# Patient Record
Sex: Female | Born: 1980
Health system: Southern US, Community
[De-identification: ages and names within clinical notes are randomized; demographics above are authoritative.]

## PROBLEM LIST (undated history)

## (undated) ENCOUNTER — Inpatient Hospital Stay (HOSPITAL_COMMUNITY): Payer: Self-pay

## (undated) DIAGNOSIS — G5602 Carpal tunnel syndrome, left upper limb: Secondary | ICD-10-CM

## (undated) HISTORY — PX: WISDOM TOOTH EXTRACTION: SHX21

---

## 1999-12-09 ENCOUNTER — Other Ambulatory Visit
Admission: RE | Admit: 1999-12-09 | Discharge: 1999-12-09 | Disposition: A | Discharge status: Home / Self Care | Payer: 59 | Source: Ambulatory Visit | Attending: Plastic Surgery | Admitting: Plastic Surgery

## 2001-10-09 ENCOUNTER — Other Ambulatory Visit: Admission: RE | Admit: 2001-10-09 | Discharge: 2001-10-09 | Payer: Self-pay | Admitting: Obstetrics and Gynecology

## 2003-01-29 ENCOUNTER — Other Ambulatory Visit: Admission: RE | Admit: 2003-01-29 | Discharge: 2003-01-29 | Payer: Self-pay | Admitting: Obstetrics and Gynecology

## 2004-01-15 ENCOUNTER — Other Ambulatory Visit: Admission: RE | Admit: 2004-01-15 | Discharge: 2004-01-15 | Payer: Self-pay | Admitting: Obstetrics & Gynecology

## 2007-03-02 ENCOUNTER — Emergency Department (HOSPITAL_COMMUNITY): Admission: EM | Admit: 2007-03-02 | Discharge: 2007-03-02 | Payer: Self-pay | Admitting: Family Medicine

## 2007-06-03 ENCOUNTER — Inpatient Hospital Stay: Admission: AD | Admit: 2007-06-03 | Discharge: 2007-06-03 | Payer: Self-pay | Admitting: Obstetrics & Gynecology

## 2007-06-20 ENCOUNTER — Inpatient Hospital Stay (HOSPITAL_COMMUNITY): Admission: AD | Admit: 2007-06-20 | Discharge: 2007-06-20 | Payer: Self-pay | Admitting: *Deleted

## 2007-07-08 ENCOUNTER — Inpatient Hospital Stay (HOSPITAL_COMMUNITY): Admission: AD | Admit: 2007-07-08 | Discharge: 2007-07-10 | Payer: Self-pay | Admitting: Obstetrics

## 2007-07-15 ENCOUNTER — Inpatient Hospital Stay (HOSPITAL_COMMUNITY): Admission: AD | Admit: 2007-07-15 | Discharge: 2007-07-15 | Payer: Self-pay | Admitting: Obstetrics & Gynecology

## 2007-08-09 LAB — CONVERTED CEMR LAB: Pap Smear: NORMAL

## 2007-08-14 ENCOUNTER — Ambulatory Visit (HOSPITAL_COMMUNITY): Admission: RE | Admit: 2007-08-14 | Discharge: 2007-08-14 | Payer: Self-pay | Admitting: Obstetrics & Gynecology

## 2007-09-27 ENCOUNTER — Ambulatory Visit (HOSPITAL_COMMUNITY): Admission: RE | Admit: 2007-09-27 | Discharge: 2007-09-27 | Payer: Self-pay | Admitting: Otolaryngology

## 2007-10-15 ENCOUNTER — Encounter (INDEPENDENT_AMBULATORY_CARE_PROVIDER_SITE_OTHER): Payer: Self-pay | Admitting: Otolaryngology

## 2007-10-15 ENCOUNTER — Ambulatory Visit (HOSPITAL_BASED_OUTPATIENT_CLINIC_OR_DEPARTMENT_OTHER): Admission: RE | Admit: 2007-10-15 | Discharge: 2007-10-15 | Payer: Self-pay | Admitting: Otolaryngology

## 2007-10-15 HISTORY — PX: OTHER SURGICAL HISTORY: SHX169

## 2007-12-18 ENCOUNTER — Ambulatory Visit: Payer: Self-pay | Admitting: Family Medicine

## 2007-12-18 DIAGNOSIS — G5600 Carpal tunnel syndrome, unspecified upper limb: Secondary | ICD-10-CM | POA: Insufficient documentation

## 2007-12-18 DIAGNOSIS — R011 Cardiac murmur, unspecified: Secondary | ICD-10-CM | POA: Insufficient documentation

## 2007-12-18 DIAGNOSIS — R030 Elevated blood-pressure reading, without diagnosis of hypertension: Secondary | ICD-10-CM | POA: Insufficient documentation

## 2007-12-18 DIAGNOSIS — G43009 Migraine without aura, not intractable, without status migrainosus: Secondary | ICD-10-CM | POA: Insufficient documentation

## 2007-12-18 DIAGNOSIS — D509 Iron deficiency anemia, unspecified: Secondary | ICD-10-CM | POA: Insufficient documentation

## 2008-01-01 ENCOUNTER — Encounter: Payer: Self-pay | Admitting: Family Medicine

## 2008-10-14 ENCOUNTER — Emergency Department (HOSPITAL_COMMUNITY): Admission: EM | Admit: 2008-10-14 | Discharge: 2008-10-14 | Payer: Self-pay | Admitting: Family Medicine

## 2009-02-05 ENCOUNTER — Ambulatory Visit: Payer: Self-pay | Admitting: Family Medicine

## 2009-02-06 ENCOUNTER — Encounter (INDEPENDENT_AMBULATORY_CARE_PROVIDER_SITE_OTHER): Payer: Self-pay | Admitting: Internal Medicine

## 2009-02-06 ENCOUNTER — Telehealth (INDEPENDENT_AMBULATORY_CARE_PROVIDER_SITE_OTHER): Payer: Self-pay | Admitting: Internal Medicine

## 2009-02-10 ENCOUNTER — Ambulatory Visit: Payer: Self-pay | Admitting: Family Medicine

## 2009-02-10 DIAGNOSIS — J209 Acute bronchitis, unspecified: Secondary | ICD-10-CM | POA: Insufficient documentation

## 2009-06-18 ENCOUNTER — Ambulatory Visit: Payer: Self-pay | Admitting: Family Medicine

## 2009-06-18 DIAGNOSIS — R3915 Urgency of urination: Secondary | ICD-10-CM | POA: Insufficient documentation

## 2009-06-18 DIAGNOSIS — R350 Frequency of micturition: Secondary | ICD-10-CM | POA: Insufficient documentation

## 2009-06-18 LAB — CONVERTED CEMR LAB
Bilirubin Urine: NEGATIVE
Glucose, Urine, Semiquant: NEGATIVE
Ketones, urine, test strip: NEGATIVE
Nitrite: NEGATIVE
Specific Gravity, Urine: 1.02

## 2009-07-16 ENCOUNTER — Ambulatory Visit: Payer: Self-pay | Admitting: Family Medicine

## 2009-07-16 DIAGNOSIS — R229 Localized swelling, mass and lump, unspecified: Secondary | ICD-10-CM | POA: Insufficient documentation

## 2010-03-22 ENCOUNTER — Ambulatory Visit (HOSPITAL_COMMUNITY)
Admission: RE | Admit: 2010-03-22 | Discharge: 2010-03-22 | Payer: Self-pay | Source: Home / Self Care | Admitting: Obstetrics & Gynecology

## 2010-08-09 ENCOUNTER — Ambulatory Visit (HOSPITAL_COMMUNITY)
Admission: RE | Admit: 2010-08-09 | Discharge: 2010-08-09 | Payer: Self-pay | Source: Home / Self Care | Attending: Obstetrics and Gynecology | Admitting: Obstetrics and Gynecology

## 2010-08-29 ENCOUNTER — Encounter: Payer: Self-pay | Admitting: Otolaryngology

## 2010-12-21 NOTE — H&P (Signed)
[name], [name]              ACCOUNT NO.:  [id number]   MEDICAL RECORD NO.:  [id number]          PATIENT TYPE:  MAT   LOCATION:  [facility]L                          FACILITY:  [facility]   PHYSICIAN:  Lendon Colonel, MD   DATE OF BIRTH:  17-Mar-1981   DATE OF ADMISSION:  06/03/2007  DATE OF DISCHARGE:  06/03/2007                              HISTORY & PHYSICAL   CHIEF COMPLAINT:  Vaginal bleeding.   HISTORY OF PRESENT ILLNESS:  This is a 30 year old gravida 1, at 35+  weeks who presents with vaginal spotting with wiping.  She reports no  bright red bleeding per vagina, but occasional passage of small blood  clots with urination.  The patient notes good fetal movement, occasional  contractions, and no leakage of fluid.  The patient does have some  terminal dysuria, but no fevers, and no change to the back pain that she  has been having for several weeks.  Of note, the patient is GBS  positive.  She has had three urinary tract infections this pregnancy and  she has had preterm cervical dilation since 28 weeks where she was found  to be 1 cm dilated.   PAST MEDICAL HISTORY:  None.   MEDICATIONS:  Prenatal vitamins.   ALLERGIES:  No known drug allergies.   PHYSICAL EXAMINATION:  VITAL SIGNS:  She was afebrile and vital signs  stable.  HEART:  Regular.  LUNGS:  Clear.  No costovertebral angle tenderness.  ABDOMEN:  Gravid and nontender.  PELVIC:  Per the nurse her cervix was 2 cm dilated and had not changed  significantly from her last noted cervical examination in the office.  Her EFM tocometry strip showed contractions every two minutes.  Fetal  heart rate tracing was in the 140s with good accelerations, no  decelerations, and 10 beats of variability.   The patient's blood type is A negative.   ASSESSMENT:  This is a 30 year old gravida 1 at 35+ weeks with preterm  contractions, but no evidence of significant cervical change.  The  patient was discharged to home with labor  precautions.  She was asked to  continue to hydrate and a urine culture was sent given the microscopic  hematuria and history of prior urinary tract infection.  The patient was  also told to call if she developed any fevers, worsening back pain, or  decreased fetal movement.      Lendon Colonel, MD  Electronically Signed     KAF/MEDQ  D:  06/03/2007  T:  06/04/2007  Job:  [id number]

## 2010-12-21 NOTE — H&P (Signed)
[name], [name]              ACCOUNT NO.:  [id number]   MEDICAL RECORD NO.:  [id number]          PATIENT TYPE:  MAT   LOCATION:  [facility]                          [facility]ACILITY:  [facility]   PHYSICIAN:  [place] B. Earlene Plater, M.D.  DATE OF BIRTH:  [birth year]   DATE OF ADMISSION:  06/20/2007  DATE OF DISCHARGE:  06/20/2007                              HISTORY & PHYSICAL   TIME OF VISIT:  05:50   CHIEF COMPLAINT:  Elevated blood pressure.   HISTORY OF PRESENT ILLNESS:  Patient is a 30 year old white female,  gravida 1, para 0, at 37 weeks, who presented to maternity admissions  for further evaluation of elevated blood pressure.  She was noted to  have pressures in the office earlier this week in the 140s/80s-90s  range.  The patient was instructed to have relative rest and monitor  blood pressure at home.  Blood pressures at home have run in the 140s-  150s/90s range.  Of note, her cuff has not been validated against the  office cuff yet.  She reports previous headache, earlier today, which  resolved spontaneously.  At this point, she has no headache.  At no time  has she had visual disturbances or abdominal pain.  Reports good fetal  movement.  She did earlier have a run of contractions, which have now  resolved.  No leakage of fluid or vaginal bleeding.   PAST MEDICAL HISTORY:  History of palpitations and previous echo was  normal.  Migraines, followed by Dr. Meryl Crutch.   PAST SURGICAL HISTORY:  None.   SOCIAL HISTORY:  Noncontributory.   MEDICATIONS:  Prenatal vitamin.   FAMILY HISTORY:  Noncontributory.   ALLERGIES:  None.   PHYSICAL EXAM:  VITAL SIGNS:  Blood pressures from time of admission  have ranged from 137-153/83-96.  One blood pressure in the 120/60 range.  IN GENERAL:  The patient is alert and oriented, in no acute distress.  SKIN:  Warm and dry without lesions.  ABDOMEN:  Nontender.  Cervix was 3 cm dilated and 50% effaced, per nursing report.  Fetal  heart tones in the 130s  and reactive.  Previously with intermittent  contractions, which have now resolved.  LOWER EXTREMITIES:  Show no edema.  Reflexes are 1+, no clonus.   LABORATORY EVALUATION:  Urinalysis is negative for protein.  CBC was  within normal limits.  PIH labs were normal, other than SGOT of 40.  Uric acid and SGPT were normal.   ASSESSMENT:  Probable gestational hypertension, possible mild pre-  eclampsia.  At this point, patient has not demonstrated proteinuria in  the office or in the hospital.  Therefore, recommend that she undergo 24-  hour urine for further assessment.   PLAN:  Discharge to home.  Bed rest and initiate 24-hour urine.  Patient  will return to the office tomorrow to submit her urine specimen and for  recheck of blood pressure and pre-eclampsia labs.  Patient is given pre-  eclampsia precautions and instructed to call with any increased blood  pressures above the current levels.      Gerri Spore B. Earlene Plater, M.D.  Electronically Signed     WBD/MEDQ  D:  06/20/2007  T:  06/20/2007  Job:  [id number]

## 2010-12-21 NOTE — Op Note (Signed)
NAMEMIHAELA, [name]              ACCOUNT NO.:  [id number]   MEDICAL RECORD NO.:  [id number]          PATIENT TYPE:  AMB   LOCATION:  DSC                          FACILITY:  [facility]   PHYSICIAN:  Newman Pies, MD            DATE OF BIRTH:  [birth year]   DATE OF PROCEDURE:  10/15/2007  DATE OF DISCHARGE:                               OPERATIVE REPORT   SURGEON:  Newman Pies, MD   PREOPERATIVE DIAGNOSIS:  Left level II neck mass.   POSTOPERATIVE DIAGNOSIS:  Left level II neck mass.   PROCEDURE PERFORMED:  Excisional biopsy of left deep neck mass.   ANESTHESIA:  Laryngeal mask anesthesia.   COMPLICATIONS:  None.   ESTIMATED BLOOD LOSS:  20 mL.   INDICATIONS FOR PROCEDURE:  [name] is a 30 year old white female  with a history of a left level II neck mass, presumably due to  lymphadenopathy.  She was treated with multiple courses of antibiotics.  However, the mass persisted.  Based on that finding, the decision was  made for the patient to undergo excisional biopsy of the 2-3-cm left  upper neck mass.  The risks, benefits, alternatives, and details of  procedure were discussed with the patient.  She would like to proceed  with the procedure.  Informed consent was obtained.   DESCRIPTION:  The patient was taken to the operating room and placed  supine on the operating table.  Laryngeal mask anesthesia was used by  the anesthesiologist.  The patient was then positioned and prepped and  draped in a standard fashion for excisional biopsy of the left neck  mass.  Lidocaine 1% with 1:100,000 epinephrine were injected at the  planned site of incision.  An approximately 4-cm incision was made at  the left neck, along the skin crease.  The incision was carried down  past the level of the platysma muscles.  A superiorly-based subplatysmal  flap was elevated in the standard fashion.  The sternocleidomastoid  muscle was identified and retracted laterally.  A large, approximately 3-  cm  enlarged mass was noted within the level II region.  The mass was  noted to displace the jugular vein laterally.  The mass was carefully  dissected free from the surrounding tissue with a combination of sharp  and blunt dissection.  The digastric muscle was identified.  Next it was  carefully dissected free from the carotid arteries.  Hemostasis was  achieved with a combination of Bovie electrocautery and ligation of the  vessels.  The mass measured approximately 3.5 cm in diameter.  It was  sent to the pathology department as a fresh specimen of for lymphoma  protocol identification.  The surgical site was copiously irrigated.  The incision was closed in layers with 3-0 Vicryl and 5-0 Prolene  sutures.  The care of the patient was turned over to the  anesthesiologist.  The patient was awakened from anesthesia without  difficulty.  She was extubated and transferred to the recovery room in  good condition.   OPERATIVE FINDINGS:  A 3.5-cm neck mass was  noted within the left level  II region.   SPECIMENS REMOVED:  Left neck mass.   FOLLOW-UP CARE:  The patient will be discharged home once she is awake  and alert.  She will be placed on Keflex 500 mg p.o. q.i.d. for 3 days.  She may also take Percocet the one to two tablets p.o. q.4-6 h. p.r.n.  pain.  She will follow up in my office in approximately 1 week.      Newman Pies, MD  Electronically Signed     ST/MEDQ  D:  10/15/2007  T:  10/16/2007  Job:  [id number]   cc:   Tammy R. Collins [place], M.D.

## 2011-05-02 LAB — POCT PREGNANCY, URINE
Operator id: 279391
Preg Test, Ur: NEGATIVE

## 2011-05-02 LAB — POCT HEMOGLOBIN-HEMACUE: Hemoglobin: 14

## 2011-05-16 LAB — CBC
HCT: 30.7 — ABNORMAL LOW
Hemoglobin: 10.8 — ABNORMAL LOW
Hemoglobin: 12.2
MCHC: 35.3
MCV: 90.9
MCV: 92.3
Platelets: 117 — ABNORMAL LOW
Platelets: 146 — ABNORMAL LOW
Platelets: 291
RBC: 3.29 — ABNORMAL LOW
RDW: 12.3
RDW: 12.7
WBC: 11.3 — ABNORMAL HIGH
WBC: 16.2 — ABNORMAL HIGH

## 2011-05-16 LAB — COMPREHENSIVE METABOLIC PANEL
AST: 29
Albumin: 3.6
BUN: 9
Chloride: 101
GFR calc non Af Amer: >60
Potassium: 3.8
Total Protein: 7.3

## 2011-05-16 LAB — URIC ACID: Uric Acid, Serum: 5.1

## 2011-05-16 LAB — LACTATE DEHYDROGENASE: LDH: 162

## 2011-05-16 LAB — RH IMMUNE GLOB WKUP(>/=20WKS)(NOT WOMEN'S HOSP)

## 2011-05-16 LAB — SAMPLE TO BLOOD BANK

## 2011-05-17 LAB — CBC
HCT: 38.9
Hemoglobin: 12.7
MCHC: 35.3
MCV: 90.5
MCV: 90.3
Platelets: 177
RBC: 4.29
RBC: 3.99
RDW: 12.4
WBC: 13.3 — ABNORMAL HIGH
WBC: 10.7 — ABNORMAL HIGH

## 2011-05-17 LAB — TYPE AND SCREEN
ABO/RH(D): A NEG
Antibody Screen: POSITIVE
DAT, IgG: NEGATIVE
Weak D: NEGATIVE

## 2011-05-17 LAB — COMPREHENSIVE METABOLIC PANEL
ALT: 40 — ABNORMAL HIGH
AST: 28
Alkaline Phosphatase: 269 — ABNORMAL HIGH
CO2: 25
Chloride: 104
GFR calc Af Amer: >60
GFR calc non Af Amer: >60
Glucose, Bld: 84
Sodium: 135
Total Bilirubin: 0.7

## 2011-05-17 LAB — URIC ACID: Uric Acid, Serum: 4.6

## 2011-05-17 LAB — URINE MICROSCOPIC-ADD ON

## 2011-05-17 LAB — ALT: ALT: 29

## 2011-05-17 LAB — URINALYSIS, ROUTINE W REFLEX MICROSCOPIC
Bilirubin Urine: NEGATIVE
Hgb urine dipstick: NEGATIVE
Ketones, ur: NEGATIVE
Nitrite: NEGATIVE
Specific Gravity, Urine: <1.005 — ABNORMAL LOW
Urobilinogen, UA: 0.2

## 2011-05-17 LAB — CREATININE, SERUM
Creatinine, Ser: 0.63
GFR calc Af Amer: >60

## 2011-05-17 LAB — LACTATE DEHYDROGENASE: LDH: 121

## 2011-05-18 LAB — URINALYSIS, ROUTINE W REFLEX MICROSCOPIC
Bilirubin Urine: NEGATIVE
Glucose, UA: NEGATIVE
Ketones, ur: NEGATIVE
Protein, ur: NEGATIVE
Urobilinogen, UA: 0.2

## 2011-05-18 LAB — URINE MICROSCOPIC-ADD ON

## 2011-05-18 LAB — URINE CULTURE
Colony Count: NO GROWTH
Culture: NO GROWTH

## 2011-05-23 LAB — POCT URINALYSIS DIP (DEVICE)
Ketones, ur: NEGATIVE
Protein, ur: NEGATIVE
Urobilinogen, UA: 0.2
pH: 7

## 2011-08-09 NOTE — L&D Delivery Note (Signed)
Delivery Note At 3:24 PM a viable female was delivered via Vaginal, Spontaneous Delivery (Presentation: Left Occiput Anterior).  APGAR: 5, 9; weight 6 lb 6.3 oz (2900 g).  Pt made rapid transition from 6 to 8cm to complete, +2. Over last 10 minutes FH in 80's (though difficult to trace due to maternal movement). Once I arrived in room, pt positioned for delivery and with 5 pushed over 2 contractions, SVD of female fetus. Continual progress with each push. No nuchal, shoulders followed easily, and baby to peds. 2nd degree laceration noted with brisk bleeding. With retraction from RN for exposure bleeding controlled with running 2-0 vicryl rapide. Episiotomy repaired in standard fashion. After delivery of baby, pitocin started (pt with history of PP hemorrhage). After 30 minutes and draining of cord, placenta still did not separate. D/w pt need for manual removal of placenta. 4mg  IV morphine given. Several attempts to remove placenta unsucessful, decision made to proceed to OR for removal. Risks d/w pt and husband who agree.  Placenta status: retained- needed OR manual extraction, removed in pieces, complete by intrauterine exam and no evidence retained POC's by intraop u/s. [place] to  Pathology Cord: 3 vessels with the following complications: None.  Cord pH: not done  Anesthesia: None  Episiotomy: None Lacerations: 2nd degree;Vaginal Suture Repair: 2.0 vicryl rapide Est. Blood Loss (mL): 500  Mom to OR.  Baby to nursery-stable.  Antony Sian A. 03/19/2012, 6:02 PM

## 2011-09-08 LAB — OB RESULTS CONSOLE HEPATITIS B SURFACE ANTIGEN: Hepatitis B Surface Ag: NEGATIVE

## 2011-09-08 LAB — OB RESULTS CONSOLE RUBELLA ANTIBODY, IGM: Rubella: IMMUNE

## 2011-09-08 LAB — OB RESULTS CONSOLE ABO/RH

## 2012-02-22 LAB — OB RESULTS CONSOLE GBS: GBS: NEGATIVE

## 2012-03-11 ENCOUNTER — Encounter (HOSPITAL_COMMUNITY): Payer: Self-pay | Admitting: *Deleted

## 2012-03-11 ENCOUNTER — Inpatient Hospital Stay (HOSPITAL_COMMUNITY)
Admission: AD | Admit: 2012-03-11 | Discharge: 2012-03-11 | Disposition: A | Discharge status: Home / Self Care | Payer: 59 | Source: Ambulatory Visit | Attending: Obstetrics | Admitting: Obstetrics

## 2012-03-11 DIAGNOSIS — R030 Elevated blood-pressure reading, without diagnosis of hypertension: Secondary | ICD-10-CM | POA: Insufficient documentation

## 2012-03-11 DIAGNOSIS — O99891 Other specified diseases and conditions complicating pregnancy: Secondary | ICD-10-CM | POA: Insufficient documentation

## 2012-03-11 LAB — URINALYSIS, ROUTINE W REFLEX MICROSCOPIC
Bilirubin Urine: NEGATIVE
Ketones, ur: NEGATIVE mg/dL
Nitrite: NEGATIVE
Protein, ur: NEGATIVE mg/dL
pH: 7 (ref 5.0–8.0)

## 2012-03-11 LAB — CBC
MCHC: 35.2 g/dL (ref 30.0–36.0)
Platelets: 139 10*3/uL — ABNORMAL LOW (ref 150–400)
RDW: 12.6 % (ref 11.5–15.5)

## 2012-03-11 LAB — COMPREHENSIVE METABOLIC PANEL
Albumin: 2.9 g/dL — ABNORMAL LOW (ref 3.5–5.2)
BUN: 8 mg/dL (ref 6–23)
CO2: 24 mEq/L (ref 19–32)
Chloride: 102 mEq/L (ref 96–112)
Creatinine, Ser: 0.57 mg/dL (ref 0.50–1.10)
GFR calc non Af Amer: >90 mL/min (ref >90)
Total Bilirubin: 0.4 mg/dL (ref 0.3–1.2)

## 2012-03-11 LAB — URINE MICROSCOPIC-ADD ON

## 2012-03-11 LAB — URIC ACID: Uric Acid, Serum: 4.6 mg/dL (ref 2.4–7.0)

## 2012-03-11 LAB — LACTATE DEHYDROGENASE: LDH: 208 U/L (ref 94–250)

## 2012-03-11 NOTE — ED Provider Notes (Addendum)
History  [name] 31 y.o. G2P1001 37 6/7 wks  HPP:  Increased BPs at home 140/90's max.  No Anti-HTN meds.  No PEC Sx.  No vag. Bleeding.  No UC.     Chief Complaint  Patient presents with  . Hypertension    History reviewed. No pertinent past medical history.  Past Surgical History  Procedure Date  . Soft tissue cyst excision 2009    cyst removed from neck    Family History  Problem Relation Age of Onset  . Family history unknown: Yes    History  Substance Use Topics  . Smoking status: Never Smoker   . Smokeless tobacco: Not on file  . Alcohol Use: No     occasional EtOH before pregnancy    Allergies: No Known Allergies  Prescriptions prior to admission  Medication Sig Dispense Refill  . calcium carbonate (TUMS - DOSED IN MG ELEMENTAL CALCIUM) 500 MG chewable tablet Chew 3-4 tablets by mouth 2 (two) times daily as needed. For heartburn      . ferrous gluconate (FERGON) 325 MG tablet Take 325 mg by mouth daily with breakfast.      . Prenatal Vit-Fe Fumarate-FA (PRENATAL MULTIVITAMIN) TABS Take 1 tablet by mouth daily.       Physical Exam   Blood pressure 128/75, pulse 63, temperature 97.9 F (36.6 C), temperature source Oral, resp. rate 18, height 5\' 5"  (1.651 m), weight 82.01 kg (180 lb 12.8 oz).  Mild ankle oedema No clonus, DTRs 2/4 bilat.  FHR B. Line 130's, accelerations pos, no deceleration.  Reactive. No UC.   ED Course  No PEC Sx, BPs wnl, No Proteinuria on spot urine  PIH Labs wnl except mild increase in ALT at 38 and Plts 139  A/P:  37 6/7 wks with mild increase in ALT, Plts 139, BPs wnl, no other Sx/sign of PEC.  F-well-being reassuring.          Relative rest at home.  24 hr urine Prtn.  F/U Dr Ernestina Penna Wednesday with repeat [facility] labs.  FMCs qd.  Genia Del MD  03/11/2012 at 13:36

## 2012-03-11 NOTE — MAU Note (Signed)
"  I have been monitoring my BP for the past 2-3 weeks.  It was in the 150s last night, but I didn't want to come here at midnight.  It was in the 140s this morning."

## 2012-03-11 NOTE — MAU Note (Addendum)
Pt reorts she has been monitoring her B/P it was in the 150's last night and now in th 140's . On call Nurse told her to come in and get seen. Denies H/a abd pain reports some swelling in her feet but that is not new. Good fetal movement

## 2012-03-19 ENCOUNTER — Encounter (HOSPITAL_COMMUNITY): Payer: Self-pay | Admitting: Anesthesiology

## 2012-03-19 ENCOUNTER — Telehealth (HOSPITAL_COMMUNITY): Payer: Self-pay | Admitting: *Deleted

## 2012-03-19 ENCOUNTER — Inpatient Hospital Stay (HOSPITAL_COMMUNITY)
Admission: AD | Admit: 2012-03-19 | Discharge: 2012-03-21 | DRG: 774 | Disposition: A | Discharge status: Home / Self Care | Payer: 59 | Source: Ambulatory Visit | Attending: Obstetrics & Gynecology | Admitting: Obstetrics & Gynecology

## 2012-03-19 ENCOUNTER — Inpatient Hospital Stay (HOSPITAL_COMMUNITY): Payer: 59 | Admitting: Anesthesiology

## 2012-03-19 ENCOUNTER — Encounter (HOSPITAL_COMMUNITY): Payer: Self-pay

## 2012-03-19 ENCOUNTER — Encounter (HOSPITAL_COMMUNITY): Payer: Self-pay | Admitting: *Deleted

## 2012-03-19 ENCOUNTER — Inpatient Hospital Stay (HOSPITAL_COMMUNITY): Payer: 59

## 2012-03-19 ENCOUNTER — Encounter (HOSPITAL_COMMUNITY)
Admission: AD | Disposition: A | Discharge status: Home / Self Care | Payer: Self-pay | Source: Ambulatory Visit | Attending: Obstetrics & Gynecology

## 2012-03-19 DIAGNOSIS — O139 Gestational [pregnancy-induced] hypertension without significant proteinuria, unspecified trimester: Secondary | ICD-10-CM | POA: Diagnosis present

## 2012-03-19 DIAGNOSIS — D509 Iron deficiency anemia, unspecified: Secondary | ICD-10-CM

## 2012-03-19 DIAGNOSIS — D689 Coagulation defect, unspecified: Secondary | ICD-10-CM | POA: Diagnosis present

## 2012-03-19 DIAGNOSIS — D696 Thrombocytopenia, unspecified: Secondary | ICD-10-CM | POA: Diagnosis present

## 2012-03-19 HISTORY — PX: DILATION AND EVACUATION: SHX1459

## 2012-03-19 LAB — COMPREHENSIVE METABOLIC PANEL
Albumin: 3 g/dL — ABNORMAL LOW (ref 3.5–5.2)
Alkaline Phosphatase: 144 U/L — ABNORMAL HIGH (ref 39–117)
BUN: 6 mg/dL (ref 6–23)
CO2: 22 mEq/L (ref 19–32)
Chloride: 100 mEq/L (ref 96–112)
Glucose, Bld: 79 mg/dL (ref 70–99)
Potassium: 3.8 mEq/L (ref 3.5–5.1)
Total Bilirubin: 0.5 mg/dL (ref 0.3–1.2)

## 2012-03-19 LAB — URINE MICROSCOPIC-ADD ON

## 2012-03-19 LAB — URINALYSIS, ROUTINE W REFLEX MICROSCOPIC
Glucose, UA: NEGATIVE mg/dL
Ketones, ur: NEGATIVE mg/dL
Protein, ur: 100 mg/dL — AB

## 2012-03-19 LAB — CBC
Hemoglobin: 11.7 g/dL — ABNORMAL LOW (ref 12.0–15.0)
MCH: 30.2 pg (ref 26.0–34.0)
MCHC: 34.8 g/dL (ref 30.0–36.0)

## 2012-03-19 LAB — LACTATE DEHYDROGENASE: LDH: 139 U/L (ref 94–250)

## 2012-03-19 SURGERY — DILATION AND EVACUATION, UTERUS
Anesthesia: Monitor Anesthesia Care | Site: Vagina | Wound class: Clean Contaminated

## 2012-03-19 MED ORDER — SENNOSIDES-DOCUSATE SODIUM 8.6-50 MG PO TABS
2.0000 | ORAL_TABLET | Freq: Every day | ORAL | Status: DC
  Administered 2012-03-19 – 2012-03-20 (×2): 2 via ORAL

## 2012-03-19 MED ORDER — SODIUM CHLORIDE 0.9 % IV SOLN: 3.0000 g | Freq: Once | INTRAVENOUS | Status: DC

## 2012-03-19 MED ORDER — MIDAZOLAM HCL 5 MG/5ML IJ SOLN
INTRAMUSCULAR | Status: DC | PRN
  Administered 2012-03-19: 2 mg via INTRAVENOUS

## 2012-03-19 MED ORDER — IBUPROFEN 600 MG PO TABS: 600.0000 mg | ORAL_TABLET | Freq: Four times a day (QID) | ORAL | Status: DC | PRN

## 2012-03-19 MED ORDER — OXYTOCIN 10 UNIT/ML IJ SOLN
INTRAMUSCULAR | Status: AC
  Filled 2012-03-19: qty 4

## 2012-03-19 MED ORDER — OXYCODONE-ACETAMINOPHEN 5-325 MG PO TABS: 1.0000 | ORAL_TABLET | ORAL | Status: DC | PRN

## 2012-03-19 MED ORDER — LACTATED RINGERS IV SOLN
500.0000 mL | Freq: Once | INTRAVENOUS | Status: AC
  Administered 2012-03-19: 15:00:00 via INTRAVENOUS

## 2012-03-19 MED ORDER — SODIUM CHLORIDE 0.9 % IV SOLN
3.0000 g | Freq: Once | INTRAVENOUS | Status: AC
  Administered 2012-03-19: 3 g via INTRAVENOUS
  Filled 2012-03-19: qty 3

## 2012-03-19 MED ORDER — EPHEDRINE 5 MG/ML INJ
10.0000 mg | INTRAVENOUS | Status: DC | PRN
  Filled 2012-03-19: qty 4

## 2012-03-19 MED ORDER — SIMETHICONE 80 MG PO CHEW: 80.0000 mg | CHEWABLE_TABLET | ORAL | Status: DC | PRN

## 2012-03-19 MED ORDER — LANOLIN HYDROUS EX OINT: TOPICAL_OINTMENT | CUTANEOUS | Status: DC | PRN

## 2012-03-19 MED ORDER — ONDANSETRON HCL 4 MG/2ML IJ SOLN
4.0000 mg | INTRAMUSCULAR | Status: DC | PRN
  Administered 2012-03-19: 4 mg via INTRAVENOUS
  Filled 2012-03-19: qty 2

## 2012-03-19 MED ORDER — LIDOCAINE HCL (PF) 1 % IJ SOLN
30.0000 mL | INTRAMUSCULAR | Status: DC | PRN
  Administered 2012-03-19: 30 mL via SUBCUTANEOUS
  Filled 2012-03-19: qty 30

## 2012-03-19 MED ORDER — FLEET ENEMA 7-19 GM/118ML RE ENEM: 1.0000 | ENEMA | RECTAL | Status: DC | PRN

## 2012-03-19 MED ORDER — DIPHENHYDRAMINE HCL 25 MG PO CAPS: 25.0000 mg | ORAL_CAPSULE | Freq: Four times a day (QID) | ORAL | Status: DC | PRN

## 2012-03-19 MED ORDER — ONDANSETRON HCL 4 MG/2ML IJ SOLN: 4.0000 mg | Freq: Four times a day (QID) | INTRAMUSCULAR | Status: DC | PRN

## 2012-03-19 MED ORDER — PHENYLEPHRINE 40 MCG/ML (10ML) SYRINGE FOR IV PUSH (FOR BLOOD PRESSURE SUPPORT)
80.0000 ug | PREFILLED_SYRINGE | INTRAVENOUS | Status: DC | PRN
  Filled 2012-03-19: qty 5

## 2012-03-19 MED ORDER — LACTATED RINGERS IV SOLN: INTRAVENOUS | Status: DC

## 2012-03-19 MED ORDER — ONDANSETRON HCL 4 MG PO TABS: 4.0000 mg | ORAL_TABLET | ORAL | Status: DC | PRN

## 2012-03-19 MED ORDER — PHENYLEPHRINE 40 MCG/ML (10ML) SYRINGE FOR IV PUSH (FOR BLOOD PRESSURE SUPPORT): 80.0000 ug | PREFILLED_SYRINGE | INTRAVENOUS | Status: DC | PRN

## 2012-03-19 MED ORDER — OXYTOCIN 40 UNITS IN LACTATED RINGERS INFUSION - SIMPLE MED
62.5000 mL/h | Freq: Once | INTRAVENOUS | Status: AC
  Administered 2012-03-19: 40 [IU] via INTRAVENOUS
  Filled 2012-03-19: qty 1000

## 2012-03-19 MED ORDER — LACTATED RINGERS IV SOLN: 500.0000 mL | INTRAVENOUS | Status: DC | PRN

## 2012-03-19 MED ORDER — ACETAMINOPHEN 325 MG PO TABS: 650.0000 mg | ORAL_TABLET | ORAL | Status: DC | PRN

## 2012-03-19 MED ORDER — TETANUS-DIPHTH-ACELL PERTUSSIS 5-2.5-18.5 LF-MCG/0.5 IM SUSP
0.5000 mL | Freq: Once | INTRAMUSCULAR | Status: AC
  Administered 2012-03-20: 0.5 mL via INTRAMUSCULAR
  Filled 2012-03-19: qty 0.5

## 2012-03-19 MED ORDER — BENZOCAINE-MENTHOL 20-0.5 % EX AERO: 1.0000 "application " | INHALATION_SPRAY | CUTANEOUS | Status: DC | PRN

## 2012-03-19 MED ORDER — WITCH HAZEL-GLYCERIN EX PADS: 1.0000 "application " | MEDICATED_PAD | CUTANEOUS | Status: DC | PRN

## 2012-03-19 MED ORDER — FENTANYL 2.5 MCG/ML BUPIVACAINE 1/10 % EPIDURAL INFUSION (WH - ANES)
14.0000 mL/h | INTRAMUSCULAR | Status: DC
  Filled 2012-03-19: qty 60

## 2012-03-19 MED ORDER — DIBUCAINE 1 % RE OINT: 1.0000 "application " | TOPICAL_OINTMENT | RECTAL | Status: DC | PRN

## 2012-03-19 MED ORDER — FENTANYL CITRATE 0.05 MG/ML IJ SOLN
INTRAMUSCULAR | Status: AC
  Filled 2012-03-19: qty 2

## 2012-03-19 MED ORDER — CITRIC ACID-SODIUM CITRATE 334-500 MG/5ML PO SOLN
30.0000 mL | ORAL | Status: DC | PRN
  Administered 2012-03-19: 30 mL via ORAL
  Filled 2012-03-19: qty 15

## 2012-03-19 MED ORDER — IBUPROFEN 600 MG PO TABS
600.0000 mg | ORAL_TABLET | Freq: Four times a day (QID) | ORAL | Status: DC
  Administered 2012-03-19 – 2012-03-21 (×7): 600 mg via ORAL
  Filled 2012-03-19 (×8): qty 1

## 2012-03-19 MED ORDER — DIPHENHYDRAMINE HCL 50 MG/ML IJ SOLN: 12.5000 mg | INTRAMUSCULAR | Status: DC | PRN

## 2012-03-19 MED ORDER — PROPOFOL 10 MG/ML IV EMUL
INTRAVENOUS | Status: DC | PRN
  Administered 2012-03-19: 50 mg via INTRAVENOUS
  Administered 2012-03-19: 30 mg via INTRAVENOUS
  Administered 2012-03-19: 20 mg via INTRAVENOUS
  Administered 2012-03-19: 30 mg via INTRAVENOUS

## 2012-03-19 MED ORDER — MORPHINE SULFATE 4 MG/ML IJ SOLN
4.0000 mg | Freq: Once | INTRAMUSCULAR | Status: AC
  Administered 2012-03-19: 4 mg via INTRAVENOUS

## 2012-03-19 MED ORDER — ZOLPIDEM TARTRATE 5 MG PO TABS: 5.0000 mg | ORAL_TABLET | Freq: Every evening | ORAL | Status: DC | PRN

## 2012-03-19 MED ORDER — FENTANYL CITRATE 0.05 MG/ML IJ SOLN
INTRAMUSCULAR | Status: DC | PRN
  Administered 2012-03-19: 100 ug via INTRAVENOUS
  Administered 2012-03-19: 50 ug via INTRAVENOUS
  Administered 2012-03-19 (×2): 25 ug via INTRAVENOUS

## 2012-03-19 MED ORDER — MORPHINE SULFATE 4 MG/ML IJ SOLN
INTRAMUSCULAR | Status: AC
  Filled 2012-03-19: qty 1

## 2012-03-19 MED ORDER — OXYTOCIN BOLUS FROM INFUSION
250.0000 mL | Freq: Once | INTRAVENOUS | Status: AC
  Administered 2012-03-19: 250 mL via INTRAVENOUS
  Filled 2012-03-19: qty 500

## 2012-03-19 MED ORDER — DEXTROSE 5 % IV SOLN: 4.0000 g | Freq: Three times a day (TID) | INTRAVENOUS | Status: DC

## 2012-03-19 MED ORDER — BISACODYL 10 MG RE SUPP: 10.0000 mg | Freq: Every day | RECTAL | Status: DC | PRN

## 2012-03-19 MED ORDER — MISOPROSTOL 200 MCG PO TABS
ORAL_TABLET | ORAL | Status: AC
  Filled 2012-03-19: qty 5

## 2012-03-19 MED ORDER — EPHEDRINE 5 MG/ML INJ: 10.0000 mg | INTRAVENOUS | Status: DC | PRN

## 2012-03-19 MED ORDER — MIDAZOLAM HCL 2 MG/2ML IJ SOLN
INTRAMUSCULAR | Status: AC
  Filled 2012-03-19: qty 2

## 2012-03-19 MED ORDER — LACTATED RINGERS IV SOLN
INTRAVENOUS | Status: DC | PRN
  Administered 2012-03-19 (×2): via INTRAVENOUS

## 2012-03-19 MED ORDER — NITROGLYCERIN 0.4 MG/SPRAY TL SOLN
Status: AC
  Filled 2012-03-19: qty 4.9

## 2012-03-19 MED ORDER — FLEET ENEMA 7-19 GM/118ML RE ENEM: 1.0000 | ENEMA | Freq: Every day | RECTAL | Status: DC | PRN

## 2012-03-19 MED ORDER — PRENATAL MULTIVITAMIN CH
1.0000 | ORAL_TABLET | Freq: Every day | ORAL | Status: DC
  Administered 2012-03-20 – 2012-03-21 (×2): 1 via ORAL
  Filled 2012-03-19 (×3): qty 1

## 2012-03-19 SURGICAL SUPPLY — 22 items
CATH ROBINSON RED A/P 16FR (CATHETERS) IMPLANT
CLOTH BEACON ORANGE TIMEOUT ST (SAFETY) ×2 IMPLANT
DECANTER SPIKE VIAL GLASS SM (MISCELLANEOUS) IMPLANT
GLOVE BIO SURGEON STRL SZ 6.5 (GLOVE) ×2 IMPLANT
GLOVE BIOGEL PI IND STRL 7.0 (GLOVE) ×2 IMPLANT
GLOVE BIOGEL PI INDICATOR 7.0 (GLOVE) ×2
KIT BERKELEY 1ST TRIMESTER 3/8 (MISCELLANEOUS) IMPLANT
NEEDLE SPNL 22GX3.5 QUINCKE BK (NEEDLE) IMPLANT
NS IRRIG 1000ML POUR BTL (IV SOLUTION) ×2 IMPLANT
PACK VAGINAL MINOR WOMEN LF (CUSTOM PROCEDURE TRAY) ×2 IMPLANT
PAD PREP 24X48 CUFFED NSTRL (MISCELLANEOUS) ×2 IMPLANT
SET BERKELEY SUCTION TUBING (SUCTIONS) IMPLANT
SUT VIC AB 2-0 CT1 27 (SUTURE) ×2
SUT VIC AB 2-0 CT1 TAPERPNT 27 (SUTURE) ×2 IMPLANT
SYR CONTROL 10ML LL (SYRINGE) IMPLANT
TOWEL OR 17X24 6PK STRL BLUE (TOWEL DISPOSABLE) ×4 IMPLANT
TRAY FOLEY BAG SILVER LF 14FR (CATHETERS) ×2 IMPLANT
VACURETTE 10 RIGID CVD (CANNULA) IMPLANT
VACURETTE 14MM CVD 1/2 BASE (CANNULA) IMPLANT
VACURETTE 7MM CVD STRL WRAP (CANNULA) IMPLANT
VACURETTE 8 RIGID CVD (CANNULA) IMPLANT
VACURETTE 9 RIGID CVD (CANNULA) IMPLANT

## 2012-03-19 NOTE — Telephone Encounter (Signed)
Preadmission screen  

## 2012-03-19 NOTE — Op Note (Signed)
03/19/2012  6:09 PM  PATIENT: [name] 31 y.o. female  PRE-OPERATIVE DIAGNOSIS: retained placenta  POST-OPERATIVE DIAGNOSIS: retained placenta  PROCEDURE: Procedure(s) (LRB):  U/s guided manual evacuation of placenta  SURGEON: Surgeon(s) and Role:  * Ramandeep Arington A. Ernestina Penna, MD - Primary  PHYSICIAN ASSISTANT:  ASSISTANTS: none  ANESTHESIA: IV sedation  EBL: Total I/O  In: 1200 [I.V.:1200]  Out: 800 [Urine:200; Blood:600]  BLOOD ADMINISTERED:none  DRAINS: none  LOCAL MEDICATIONS USED: NONE  SPECIMEN: Source of Specimen: placenta  DISPOSITION OF SPECIMEN: PATHOLOGY  COUNTS: YES  TOURNIQUET: * No tourniquets in log *  DICTATION: .Note written in EPIC  PLAN OF CARE: Admit to inpatient  PATIENT DISPOSITION: PACU - hemodynamically stable.  Delay start of Pharmacological VTE agent (>24hrs) due to surgical blood loss or risk of bleeding: yes Antibiotics 3 g of Unasyn Complications: None Findings: Such a second degree perineal laceration, contracted lower uterine segment which relaxed with nitroglycerin, manual removal of placenta in several pieces, ultrasound findings of a thin endometrial stripe without significant blood flow indicating no evidence of retained products of conception at the end of the procedure, no abdominal free fluid  Indications: This is a 31 year old G2 P2, status post precipitous vaginal delivery with retained placenta. After baby was delivered cord was clamped and cut, Pitocin was started given her history of postpartum hemorrhage in a prior pregnancy. Secondary repair was completed. After 30 minutes the placenta did not, on its own despite gentle downward traction, fundal massage, Pitocin, and maternal pushing. Discussion was had with the patient regarding manual extraction of the placenta. She was given 4 mg IV morphine in the labor room and several attempts were made for a manual extraction of the placenta. This was unsuccessful. At this point decision was made to  take the patient to the operating room for removal  Sterile prep was done, IV sedation was had, patient received sublingual nitroglycerin. A Foley catheter was used to drain the patient's bladder. Once the lower segment had relaxed a gloved hand was placed into the uterus and several attempts were made to extract the placenta. The planes were difficult to assess, especially as the umbilical cord had avulsed high on the placenta earlier in the labor floor. Given the inability to find a clear plane between the placenta and the uterine wall ultrasound guidance was asked for. Once ultrasound was in the room the placenta separated quite easily, in 2 pieces. On gross pathology these pieces did not look completes. However several prior membranous pieces were obtained both in the labor room and in the operating room. By ultrasound it did not appear there was any remaining placenta. The endometrial stripe was thin and no blood flow was noted to the endometrium. Patient had minimal bleeding. A another sweep was done of the uterine cavity and no retained products were noted the decision was made to end the case. Uterine massage was done and Pitocin was restarted  Some bleeding was noted from the prior obstetrical laceration and several sutures of 2-0 Vicryl were placed in figure-of-eight fashion. Hemostasis was assured  Sponge lap and needle counts are correct x3 and patient was taken to the recovery room in a stable condition  Joah Patlan A. 03/19/2012 6:28 PM

## 2012-03-19 NOTE — MAU Note (Signed)
Pt has been having braxton hicks contractions.  She states in last hour they have been coming more intense but not sure how often.  Mucous blood tinge discharge since this am.  No gross ROM.  Good fetal movement.

## 2012-03-19 NOTE — Anesthesia Postprocedure Evaluation (Signed)
  Anesthesia Post-op Note  Patient: [name]  Procedure(s) Performed: Procedure(s) (LRB): DILATATION AND EVACUATION (N/A)  Patient Location: PACU  Anesthesia Type: MAC  Level of Consciousness: awake, alert  and oriented  Airway and Oxygen Therapy: Patient Spontanous Breathing  Post-op Pain: mild  Post-op Assessment: Post-op Vital signs reviewed, Patient's Cardiovascular Status Stable, Respiratory Function Stable, Patent Airway, No signs of Nausea or vomiting and Pain level controlled  Post-op Vital Signs: Reviewed and stable  Complications: No apparent anesthesia complications

## 2012-03-19 NOTE — Brief Op Note (Signed)
03/19/2012  6:09 PM  PATIENT:  [name]  31 y.o. female  PRE-OPERATIVE DIAGNOSIS:  retained placenta  POST-OPERATIVE DIAGNOSIS:  retained placenta  PROCEDURE:  Procedure(s) (LRB): U/s guided manual evacuation of placenta  SURGEON:  Surgeon(s) and Role:    * Jakye Mullens A. Ernestina Penna, MD - Primary  PHYSICIAN ASSISTANT:   ASSISTANTS: none   ANESTHESIA:   IV sedation  EBL:  Total I/O In: 1200 [I.V.:1200] Out: 800 [Urine:200; Blood:600]  BLOOD ADMINISTERED:none  DRAINS: none   LOCAL MEDICATIONS USED:  NONE  SPECIMEN:  Source of Specimen:  placenta  DISPOSITION OF SPECIMEN:  PATHOLOGY  COUNTS:  YES  TOURNIQUET:  * No tourniquets in log *  DICTATION: .Note written in EPIC  PLAN OF CARE: Admit to inpatient   PATIENT DISPOSITION:  PACU - hemodynamically stable.   Delay start of Pharmacological VTE agent (>24hrs) due to surgical blood loss or risk of bleeding: yes

## 2012-03-19 NOTE — Anesthesia Preprocedure Evaluation (Signed)
Anesthesia Evaluation  Patient identified by MRN, date of birth, ID band Patient awake    Reviewed: Allergy & Precautions, H&P , NPO status , Patient's Chart, lab work & pertinent test results, reviewed documented beta blocker date and time   Airway Mallampati: III TM Distance: >3 FB Neck ROM: Full    Dental No notable dental hx. (+) Teeth Intact   Pulmonary Recent URI , Resolved,  breath sounds clear to auscultation  Pulmonary exam normal       Cardiovascular hypertension, Pt. on medications and Pt. on home beta blockers + dysrhythmias + Valvular Problems/Murmurs MR Rhythm:Regular Rate:Normal + Systolic murmurs PIH   Neuro/Psych  Headaches, Carpal Tunnel Syndrome  Neuromuscular disease negative psych ROS   GI/Hepatic Neg liver ROS, GERD-  Medicated and Controlled,  Endo/Other  negative endocrine ROS  Renal/GU negative Renal ROS  negative genitourinary   Musculoskeletal negative musculoskeletal ROS (+)   Abdominal   Peds  Hematology  (+) Blood dyscrasia, anemia ,   Anesthesia Other Findings   Reproductive/Obstetrics Retained Placenta                           Anesthesia Physical Anesthesia Plan  ASA: II and Emergent  Anesthesia Plan: MAC   Post-op Pain Management:    Induction: Intravenous  Airway Management Planned: Natural Airway  Additional Equipment:   Intra-op Plan:   Post-operative Plan:   Informed Consent: I have reviewed the patients History and Physical, chart, labs and discussed the procedure including the risks, benefits and alternatives for the proposed anesthesia with the patient or authorized representative who has indicated his/her understanding and acceptance.   Dental advisory given  Plan Discussed with: CRNA, Anesthesiologist and Surgeon  Anesthesia Plan Comments: (Will attempt to relax uterus with SL NTG spray.)        Anesthesia Quick Evaluation

## 2012-03-19 NOTE — Transfer of Care (Signed)
Immediate Anesthesia Transfer of Care Note  Patient: [name]  Procedure(s) Performed: Procedure(s) (LRB): DILATATION AND EVACUATION (N/A)  Patient Location: PACU  Anesthesia Type: MAC  Level of Consciousness: awake, alert  and oriented  Airway & Oxygen Therapy: Patient Spontanous Breathing  Post-op Assessment: Report given to PACU RN and Post -op Vital signs reviewed and stable  Post vital signs: Reviewed and stable  Complications: No apparent anesthesia complications

## 2012-03-19 NOTE — Progress Notes (Signed)
Delivery of live viable female by Dr Ernestina Penna NICU at bedside for assessment APGARS 5,9

## 2012-03-19 NOTE — H&P (Signed)
[name] is a 31 y.o. female G2P1001, at 38.6/7 wks with ROM. IVF pregnancy.  Maternal Medical History:  Reason for admission: Reason for admission: rupture of membranes.  Contractions: Onset was 1-2 hours ago.   Frequency: irregular.   Perceived severity is mild.    Fetal activity: Perceived fetal activity is normal.   Last perceived fetal movement was within the past hour.    Prenatal complications: Hypertension and thrombocytopenia.    Feels some UCs, getting more closer (q 5 min), leaking pink tinged fluid. Feels good FMs.  ROS (see below)- neg for PEC s/s. Took 50 mg Labetalol this morning  [facility]- [facility], Dr Algie Coffer. Low Platelets, Gestational HTN diagnosed without e/o Preclampsia (nl labs and 24 hr urine). Last sono 8/9 7'15", nl AFI  OB History    Grav Para Term Preterm Abortions TAB SAB Ect Mult Living   2 1 1       1      Past Medical History  Diagnosis Date  . Anemia   . Pregnancy induced hypertension   . Female infertility of unspecified origin   . Heart murmur   . Dysrhythmia   . H/O varicella   . Headache     migraine  . Normal labor 03/19/2012   Past Surgical History  Procedure Date  . Soft tissue cyst excision 2009    cyst removed from neck  . Wisdom tooth extraction   . Oral mucocele excision 2007    of lip   Family History: family history includes Arthritis in her mother; Cancer in her maternal aunt; Heart attack in her maternal grandmother and paternal grandfather; Heart disease in her maternal grandfather; Heart murmur in her mother; Hyperlipidemia in her mother and paternal grandmother; and Migraines in her mother.  There is no history of Other. Social History:  reports that she has never smoked. She has never used smokeless tobacco. She reports that she does not drink alcohol or use illicit drugs.   Prenatal Transfer Tool  Maternal Diabetes: No Genetic Screening: Normal Maternal Ultrasounds/Referrals: Normal Fetal Ultrasounds or other  Referrals:  None Maternal Substance Abuse:  No Significant Maternal Medications:  Labetalol Significant Maternal Lab Results:  Lab values include: Group B Strep negative, Rh negative, s/p Rhogam at 28 wks. Low Platelets, Preeclampsia labs negative in office.   Review of Systems  Constitutional: Negative for fever.  Eyes: Negative for blurred vision and photophobia.  Respiratory: Negative for shortness of breath.   Cardiovascular: Negative for chest pain.  Gastrointestinal: Negative for abdominal pain.  Neurological: Negative for dizziness and headaches.    Exam Physical Exam  Blood pressure 147/93, pulse 71, temperature 98.6 F (37 C), temperature source Oral, resp. rate 18, height 5' 5.25" (1.657 m), weight 179 lb 12.8 oz (81.557 kg), last menstrual period 06/21/2011, SpO2 99.00%. A&O x 3, no acute distress. Pleasant HEENT neg Lungs CTA bilat CV RRR, S1S2 normal Abdo soft, non tender, non acute, relaxed, soft uterus.  Extr no edema/ tenderness FHT  120s/ moderate variability/ + accels/ no decels- category I Toco q 3-5 minutes.  Dilation: 4, Effacement (%): 70, Station: -2, Vtx. (Exam by:: L. Paschal, RN)  Prenatal labs: ABO, Rh: A/Negative/-- (01/31 0000) Antibody: Negative (06/04 0000) Rubella: Immune (01/31 0000) RPR: Nonreactive (01/31 0000)  HBsAg: Negative (01/31 0000)  HIV: Non-reactive (01/31 0000)  GBS: Negative (07/17 0000)  Glucola nl Ultrascreen negative, AFP1 negative  Assessment/Plan: 31 yo, G2P1001, 38.6/7 wks, ROM at about 10 am (might have started leaking  sooner, but ferning negative in MAU and + in LDR at 10 am). GBS negative. Early labor. FHT category I. Desires expectant mngmt for some time, ambulate, limit pelvic exams, reassess if UCs space out. Wants to defer epidural, has a [place], who is en route.  Gest HTN, stable BPs, PEC labs pending Gest Thrombocytopenia - CBC pending Prior SVD with possible PPH'hage hx (needed pitocin for 24 hrs but no  transfusion) Will stay proactive.  Aware Dr Algie Coffer starts call at 1pm.    Michale Emmerich R 03/19/2012, 11:49 AM

## 2012-03-20 ENCOUNTER — Encounter (HOSPITAL_COMMUNITY): Payer: Self-pay | Admitting: Obstetrics and Gynecology

## 2012-03-20 LAB — COMPREHENSIVE METABOLIC PANEL
AST: 42 U/L — ABNORMAL HIGH (ref 0–37)
Albumin: 2.4 g/dL — ABNORMAL LOW (ref 3.5–5.2)
Alkaline Phosphatase: 104 U/L (ref 39–117)
CO2: 26 mEq/L (ref 19–32)
Chloride: 102 mEq/L (ref 96–112)
GFR calc non Af Amer: >90 mL/min (ref >90)
Potassium: 3.6 mEq/L (ref 3.5–5.1)
Total Bilirubin: 0.5 mg/dL (ref 0.3–1.2)

## 2012-03-20 LAB — TYPE AND SCREEN
Antibody Screen: POSITIVE
DAT, IgG: NEGATIVE

## 2012-03-20 LAB — CBC
MCH: 30.8 pg (ref 26.0–34.0)
MCHC: 35.4 g/dL (ref 30.0–36.0)
MCV: 87 fL (ref 78.0–100.0)
Platelets: 119 10*3/uL — ABNORMAL LOW (ref 150–400)
RDW: 12.7 % (ref 11.5–15.5)

## 2012-03-20 LAB — RPR: RPR Ser Ql: NONREACTIVE

## 2012-03-20 MED ORDER — RHO D IMMUNE GLOBULIN 1500 UNIT/2ML IJ SOLN
300.0000 ug | Freq: Once | INTRAMUSCULAR | Status: AC
  Administered 2012-03-20: 300 ug via INTRAMUSCULAR
  Filled 2012-03-20: qty 2

## 2012-03-20 NOTE — Anesthesia Postprocedure Evaluation (Signed)
  Anesthesia Post-op Note  Patient: [name]  Procedure(s) Performed: Procedure(s) (LRB): DILATATION AND EVACUATION (N/A)  Patient Location: Mother/Baby  Anesthesia Type: MAC  Level of Consciousness: awake, alert  and oriented  Airway and Oxygen Therapy: Patient Spontanous Breathing  Post-op Pain: none  Post-op Assessment: Post-op Vital signs reviewed and Patient's Cardiovascular Status Stable  Post-op Vital Signs: Reviewed and stable  Complications: No apparent anesthesia complications

## 2012-03-20 NOTE — Addendum Note (Signed)
Addendum  created 03/20/12 0726 by Shanon Payor, CRNA   Modules edited:Notes Section

## 2012-03-20 NOTE — Progress Notes (Signed)
Patient ID: [name], female   DOB: January 30, 1981, 31 y.o.   MRN: [id number]  PPD # 1  Subjective: Pt reports feeling well, sore and tired/ Pain controlled with ibuprofen and percocet Tolerating po/ Voiding without problems/ No n/v Bleeding is moderate Newborn info:  Information for the patient's newborn:  [name], Portee [[id number]]  female  / circ desired; planned for today/ Feeding: breast   Objective:  VS: Blood pressure 119/65, pulse 66, temperature 98.7 F (37.1 C), temperature source Oral, resp. rate 18.    Basename 03/20/12 0515 03/19/12 0945  WBC 13.5* 9.2  HGB 9.7* 11.7*  HCT 27.4* 33.6*  PLT 119* 141*    Blood type: --/--/A NEG (08/13 0515) Rubella: Immune (01/31 0000)    Physical Exam:  General: A & O x 3  alert, cooperative and no distress CV: Regular rate and rhythm Resp: clear Abdomen: soft, nontender, normal bowel sounds Uterine Fundus: firm, below umbilicus, nontender Perineum: well approximated; mod edema Lochia: moderate Ext: edema trace and Homans sign is negative, no sign of DVT   A/P: PPD # 1/ G2P2002 (hx pp hemorrhage in 1st preg)/ S/P SVD 2nd deg repair Retained placenta with OR manual extraction Doing well Continue routine post partum orders Anticipate D/C home in AM    Demetrius Revel, MSN, [facility] 03/20/2012, 10:15 AM

## 2012-03-21 LAB — RH IG WORKUP (INCLUDES ABO/RH): Gestational Age(Wks): 38.6

## 2012-03-21 MED ORDER — IBUPROFEN 600 MG PO TABS: 600.0000 mg | ORAL_TABLET | Freq: Four times a day (QID) | ORAL | Status: AC

## 2012-03-21 NOTE — Discharge Summary (Signed)
Obstetric Discharge Summary Reason for Admission: onset of labor and rupture of membranes Prenatal Procedures: NST and ultrasound Intrapartum Procedures: spontaneous vaginal delivery and manual removal of placenta in operating room Postpartum Procedures: Rho(D) Ig and TDaP vaccine Complications-Operative and Postpartum: 2nd degree perineal laceration and retained placenta Hemoglobin  Date Value Range Status  03/20/2012 9.7* 12.0 - 15.0 g/dL Final     DELTA CHECK NOTED     REPEATED TO VERIFY     HCT  Date Value Range Status  03/20/2012 27.4* 36.0 - 46.0 % Final    Physical Exam:  General: alert, cooperative and no distress Lochia: appropriate Uterine Fundus: firm Incision: healing well DVT Evaluation: Negative Homan's sign. No significant calf/ankle edema.  Discharge Diagnoses: Term Pregnancy-delivered and chronic iron deficiency anemia  Discharge Information: Date: 03/21/2012 Activity: pelvic rest Diet: routine Medications: PNV, Ibuprofen and Iron Condition: stable Instructions: refer to practice specific booklet Discharge to: home Follow-up Information    Follow up with [facility] MD. Schedule an appointment as soon as possible for a visit in 6 weeks.   Contact information:   [address] [place] [address] [phone]          Newborn Data: Live born female  Birth Weight: 6 lb 6.3 oz (2900 g) APGAR: 5, 9  Home with mother.  [name],[name] 03/21/2012, 11:04 AM

## 2012-03-21 NOTE — Progress Notes (Signed)
Post Partum Day #2            Information for the patient's newborn:  Frida, Wahlstrom Boy Marvalene [[id number]]  female   Feeding: breast Circ done  Subjective: No HA, SOB, CP, F/C, breast symptoms. Pain controlled w/ Motrin. Normal vaginal bleeding, no clots.      Objective:  Temp:  [98.1 F (36.7 C)-98.3 F (36.8 C)] 98.3 F (36.8 C) (08/14 0528) Pulse Rate:  [54-63] 54  (08/14 0528) Resp:  [18] 18  (08/14 0528) BP: (110-127)/(73-76) 127/76 mmHg (08/14 0528)  No intake or output data in the 24 hours ending 03/21/12 1057     Basename 03/20/12 0515 03/19/12 0945  WBC 13.5* 9.2  HGB 9.7* 11.7*  HCT 27.4* 33.6*  PLT 119* 141*    Blood type: --/--/A NEG (08/13 0515) / Rhogam given Rubella: Immune (01/31 0000)    Physical Exam:  General: alert, cooperative and no distress Uterine Fundus: firm Lochia: appropriate Perineum: repair intact, edema none DVT Evaluation: Negative Homan's sign. No significant calf/ankle edema.    Assessment/Plan: PPD # 2 / 31 y.o., G2P2002 S/P: NVB / manual removal of placenta   Principal Problem:  *Postpartum care following vaginal delivery 03/19/12 M  No evidence of hemorrhage / endometritis   normal postpartum exam  Continue current postpartum care  D/C home   LOS: 2 days   Devontre Siedschlag, CNM, MSN 03/21/2012, 10:57 AM

## 2012-03-26 ENCOUNTER — Inpatient Hospital Stay (HOSPITAL_COMMUNITY): Admission: RE | Admit: 2012-03-26 | Payer: 59 | Source: Ambulatory Visit

## 2012-03-28 ENCOUNTER — Ambulatory Visit (HOSPITAL_COMMUNITY)
Admission: RE | Admit: 2012-03-28 | Discharge: 2012-03-28 | Disposition: A | Discharge status: Home / Self Care | Payer: 59 | Source: Ambulatory Visit | Attending: Family Medicine | Admitting: Family Medicine

## 2012-03-28 NOTE — Progress Notes (Signed)
Adult Lactation Consultation Outpatient Visit Note  Patient Name: [name]                  Baby Boy Shamra Bradeen, DOB [birth year], now 48 days old, Birth Weight 6 lb. 6 oz  Date of Birth: Feb 07, 1981 Gestational Age at Delivery: Unknown Type of Delivery: SVB  Breastfeeding History: Frequency of Breastfeeding: every 2 hours or more frequent Length of Feeding: 10-15 minutes Voids: 8-10/day Stools: 3-4/day orange/mustard in color  Supplementing / Method: Pumping:  Type of Pump:  None   Frequency:  Volume:    Comments: Mom is here for help with the latch, baby is nursing well and has good weight gain, but mom reports it is taking her sometimes 15-20 minutes to obtain a latch. Baby will come on and off the breast. Once he obtains the latch he nurses well for 10-15 minutes. Mom reports some nipple tenderness but reports it is improving. Baby's weight at the Peds office yesterday was 6 lb. 14 oz.   Consultation Evaluation: On exam, mom's nipples are intact, no breakdown noted. When mom attempts to latch the baby in cross cradle hold, he wants to come on and off the breast. Had mom massage and hand express prior to latching the baby. Assisted mom with positioning, breast compression and holding the baby more securely. He latched easily, maintaining the latch, demonstrating a good rhythmic suck. Lots of swallows audible on the right breast. On the left breast, he started out the same, coming on and off the breast. With some repositioning, breast compression, he was able to obtain and maintain the latch, nursed well with lots of swallows audible. Advised mom when attempting to latch, stimulate his top lip instead of the bottom to be sure nipple is on top of the tongue and not underneath.   Initial Feeding Assessment: Pre-feed Weight:  6 lb. 15.4 oz/3158 gm Post-feed Weight:  7 lb. 0.8 oz/3198 gm Amount Transferred:  40 ml Comments:  Nursing on Right breast for 15 minutes  Additional  Feeding Assessment: Pre-feed Weight:   7 lb. 0.8 oz/3198 gm Post-feed Weight:   7 lb. 1.8 oz/3228 gm Amount Transferred:   30 ml Comments:  From left breast with nursing 10 minutes  Additional Feeding Assessment: Pre-feed Weight: Post-feed Weight: Amount Transferred: Comments:  Total Breast milk Transferred this Visit: 70 ml Total Supplement Given: none  Additional Interventions: Mom felt better with the latch and reported it felt better on her breast. Instructed to be sure she is comfortable, bringing the baby to her to breastfeed. Massage and hand express prior to attempting to latch, be sure the baby is held securely and to use breast compression stimulating the upper lip to latch baby. Keep the baby active at the breast with massage and stimulation to empty breast well, usually 15-20 minutes.  Follow-Up  prn    Alfred Levins 03/28/2012, 4:14 PM

## 2012-11-26 ENCOUNTER — Ambulatory Visit (INDEPENDENT_AMBULATORY_CARE_PROVIDER_SITE_OTHER): Payer: 59 | Admitting: Family Medicine

## 2012-11-26 ENCOUNTER — Encounter: Payer: Self-pay | Admitting: Family Medicine

## 2012-11-26 VITALS — BP 100/78 | HR 91 | Temp 98.1°F | Ht 65.0 in | Wt 143.2 lb

## 2012-11-26 DIAGNOSIS — J069 Acute upper respiratory infection, unspecified: Secondary | ICD-10-CM

## 2012-11-26 DIAGNOSIS — R509 Fever, unspecified: Secondary | ICD-10-CM

## 2012-11-26 LAB — POCT INFLUENZA A/B
Influenza A, POC: NEGATIVE
Influenza B, POC: NEGATIVE

## 2012-11-26 NOTE — Progress Notes (Signed)
Patient Name: [name] Date of Birth: [birth year] Medical Record Number: [id number]  History of Present Illness:  Patent presents with runny nose, sneezing, cough, sore throat, malaise and minimal / low-grade fever .  Son has a cold and an ear infection, felt a little car sick. Has had a headache, then woke-up with chills. Had a temp of about 100. With sudafed is not all that bad. Had a lot of myalgias.   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     The patent denies sore throat as the primary complaint. Denies sthortness of breath/wheezing, high fever, chest pain, rhinits for more than 14 days, significant myalgia, otalgia, facial pain, abdominal pain, changes in bowel or bladder.  PMH, PHS, Allergies, Problem List, Medications, Family History, and Social History have all been reviewed.  Patient Active Problem List  Diagnosis  . ANEMIA-IRON DEFICIENCY  . COMMON MIGRAINE  . CARPAL TUNNEL SYNDROME, RIGHT  . BRONCHITIS, ACUTE  . LOCALIZED SUPERFICIAL SWELLING MASS OR LUMP  . SYSTOLIC MURMUR  . FREQUENCY, URINARY  . URINARY URGENCY  . PREHYPERTENSION  . Postpartum care following vaginal delivery 03/19/12 M    Past Medical History  Diagnosis Date  . Anemia   . Pregnancy induced hypertension   . Female infertility of unspecified origin   . Heart murmur   . H/O varicella   . Headache     migraine    Past Surgical History  Procedure Laterality Date  . Soft tissue cyst excision  2009    cyst removed from neck  . Wisdom tooth extraction    . Oral mucocele excision  2007    of lip  . Dilation and evacuation  03/19/2012    Procedure: DILATATION AND EVACUATION;  Surgeon: Alphonsus Sias. Ernestina Penna, MD;  Location: [facility];  Service: Gynecology;  Laterality: N/A;  With Ultrasound    History   Social History  . Marital Status: Married    Spouse Name: N/A    Number of Children: N/A  . Years of Education: N/A   Occupational History  . Not on file.   Social History Main Topics    . Smoking status: Never Smoker   . Smokeless tobacco: Never Used  . Alcohol Use: No     Comment: occasional EtOH before pregnancy  . Drug Use: No  . Sexually Active: Yes    Birth Control/ Protection: None   Other Topics Concern  . Not on file   Social History Narrative  . No narrative on file    Family History  Problem Relation Age of Onset  . Other Neg Hx   . Migraines Mother   . Hyperlipidemia Mother   . Arthritis Mother   . Heart murmur Mother   . Cancer Maternal Aunt     breast  . Heart attack Maternal Grandmother   . Heart disease Maternal Grandfather   . Hyperlipidemia Paternal Grandmother   . Heart attack Paternal Grandfather     No Known Allergies  Current Outpatient Prescriptions on File Prior to Visit  Medication Sig Dispense Refill  . ferrous gluconate (FERGON) 325 MG tablet Take 325 mg by mouth daily with breakfast.      . Prenatal Vit-Fe Fumarate-FA (PRENATAL MULTIVITAMIN) TABS Take 1 tablet by mouth daily.       No current facility-administered medications on file prior to visit.    Review of Systems: as above, eating and drinking - tolerating PO. Urinating normally. No excessive vomitting or diarrhea. O/w as above.  Physical Exam:  Filed Vitals:   11/26/12 1125  BP: 100/78  Pulse: 91  Temp: 98.1 F (36.7 C)  TempSrc: Oral  Height: 5\' 5"  (1.651 m)  Weight: 143 lb 4 oz (64.978 kg)  SpO2: 98%    GEN: WDWN, Non-toxic, Atraumatic, normocephalic. A and O x 3. HEENT: Oropharynx clear without exudate, MMM, no significant LAD, mild rhinnorhea Ears: TM clear, COL visualized with good landmarks CV: RRR, 2/6 sem Pulm: CTA B, no wheezes, rhonchi, or crackles, normal respiratory effort. EXT: no c/c/e Psych: well oriented, neither depressed nor anxious in appearance  A/P: 1. URI. Supportive care reviewed with patient. See patient instruction section.

## 2012-12-04 ENCOUNTER — Encounter: Payer: Self-pay | Admitting: Family Medicine

## 2012-12-04 ENCOUNTER — Telehealth: Payer: Self-pay

## 2012-12-04 ENCOUNTER — Ambulatory Visit (INDEPENDENT_AMBULATORY_CARE_PROVIDER_SITE_OTHER): Payer: 59 | Admitting: Family Medicine

## 2012-12-04 VITALS — BP 100/60 | HR 65 | Temp 98.4°F | Ht 65.0 in | Wt 145.0 lb

## 2012-12-04 DIAGNOSIS — O139 Gestational [pregnancy-induced] hypertension without significant proteinuria, unspecified trimester: Secondary | ICD-10-CM

## 2012-12-04 DIAGNOSIS — Z8632 Personal history of gestational diabetes: Secondary | ICD-10-CM

## 2012-12-04 DIAGNOSIS — R229 Localized swelling, mass and lump, unspecified: Secondary | ICD-10-CM

## 2012-12-04 DIAGNOSIS — G43009 Migraine without aura, not intractable, without status migrainosus: Secondary | ICD-10-CM

## 2012-12-04 NOTE — Telephone Encounter (Signed)
pATIENT ADVISED  

## 2012-12-04 NOTE — Assessment & Plan Note (Signed)
Stable, no change

## 2012-12-04 NOTE — Assessment & Plan Note (Signed)
Well controlled on no medication  

## 2012-12-04 NOTE — Patient Instructions (Addendum)
Work on healthy eating and regular exercise. 

## 2012-12-04 NOTE — Telephone Encounter (Signed)
Pt seen today and noticed gestational diabetes listed on problem list. Pt said she has never had gestational diabetes; pt has had gestational hypertension. Pt request corrected and call back when done.

## 2012-12-04 NOTE — Progress Notes (Signed)
  Subjective:    Patient ID: [name], female    DOB: 03-24-1981, 32 y.o.   MRN: [id number]  HPI   32 year old female presents to re-establish.  She has viral illness last week that has now resolved.  She now has a 44 month old boy.  At 36 weeks she had HTN.. Started on labetol.  Post partum her BP as been well controlled.  Last CPX 04/2012... nml pap at that time.     Review of Systems  Constitutional: Negative for fever and fatigue.  HENT: Negative for ear pain.   Eyes: Negative for pain.  Respiratory: Negative for chest tightness and shortness of breath.   Cardiovascular: Negative for chest pain, palpitations and leg swelling.  Gastrointestinal: Negative for abdominal pain.  Genitourinary: Negative for dysuria.       Objective:   Physical Exam  Constitutional: Vital signs are normal. She appears well-developed and well-nourished. She is cooperative.  Non-toxic appearance. She does not appear ill. No distress.  HENT:  Head: Normocephalic.  Right Ear: Hearing, tympanic membrane, external ear and ear canal normal. Tympanic membrane is not erythematous, not retracted and not bulging.  Left Ear: Hearing, tympanic membrane, external ear and ear canal normal. Tympanic membrane is not erythematous, not retracted and not bulging.  Nose: No mucosal edema or rhinorrhea. Right sinus exhibits no maxillary sinus tenderness and no frontal sinus tenderness. Left sinus exhibits no maxillary sinus tenderness and no frontal sinus tenderness.  Mouth/Throat: Uvula is midline, oropharynx is clear and moist and mucous membranes are normal.  Eyes: Conjunctivae, EOM and lids are normal. Pupils are equal, round, and reactive to light. No foreign bodies found.  Neck: Trachea normal and normal range of motion. Neck supple. Carotid bruit is not present. No mass and no thyromegaly present.  Cardiovascular: Normal rate, regular rhythm, S1 normal, S2 normal, normal heart sounds, intact distal pulses  and normal pulses.  Exam reveals no gallop and no friction rub.   No murmur heard. Pulmonary/Chest: Effort normal and breath sounds normal. Not tachypneic. No respiratory distress. She has no decreased breath sounds. She has no wheezes. She has no rhonchi. She has no rales.  Abdominal: Soft. Normal appearance and bowel sounds are normal. There is no tenderness.  Neurological: She is alert.  Skin: Skin is warm, dry and intact. No rash noted.  Psychiatric: Her speech is normal and behavior is normal. Judgment and thought content normal. Her mood appears not anxious. Cognition and memory are normal. She does not exhibit a depressed mood.          Assessment & Plan:

## 2012-12-04 NOTE — Telephone Encounter (Signed)
This was entered in error and was already removed from problem list 20 seconds after I type it... The print out shows anything typed in.  So let her know correction was made.

## 2013-10-15 ENCOUNTER — Ambulatory Visit: Payer: 59 | Admitting: Internal Medicine

## 2013-10-15 ENCOUNTER — Ambulatory Visit (INDEPENDENT_AMBULATORY_CARE_PROVIDER_SITE_OTHER): Payer: 59 | Admitting: Internal Medicine

## 2013-10-15 ENCOUNTER — Encounter: Payer: Self-pay | Admitting: Internal Medicine

## 2013-10-15 VITALS — BP 112/70 | HR 84 | Temp 98.6°F | Wt 145.5 lb

## 2013-10-15 DIAGNOSIS — J019 Acute sinusitis, unspecified: Secondary | ICD-10-CM

## 2013-10-15 MED ORDER — AMOXICILLIN 500 MG PO TABS: 1000.0000 mg | ORAL_TABLET | Freq: Two times a day (BID) | ORAL | Status: DC

## 2013-10-15 NOTE — Assessment & Plan Note (Signed)
Seems like secondary bacterial infection Will treat with amoxil Continue the flonase and ibuprofen

## 2013-10-15 NOTE — Progress Notes (Signed)
Pre visit review using our clinic review tool, if applicable. No additional management support is needed unless otherwise documented below in the visit note. 

## 2013-10-15 NOTE — Progress Notes (Signed)
   Subjective:    Patient ID: [name], female    DOB: 05-31-1981, 33 y.o.   MRN: [id number]  HPI Has been coughing for a while Started with fever about 10 days ago---then the cough soon after Cough day and night Congestion is worse--in sinuses  Went to walk in clinic 3 days ago Given flonase--not improving  Fever came back in the past day or 2--low grade No sweats, chills or shakes Not SOB Feels some PND No sore throat now--was last week and improved No ear pain ---but hearing is muffled  Taking sudafed---helped at first but not recently Ibuprofen for fever  No current outpatient prescriptions on file prior to visit.   No current facility-administered medications on file prior to visit.    No Known Allergies  Past Medical History  Diagnosis Date  . Anemia   . Pregnancy induced hypertension   . Female infertility of unspecified origin   . Heart murmur   . H/O varicella   . Headache(784.0)     migraine    Past Surgical History  Procedure Laterality Date  . Soft tissue cyst excision  2009    cyst removed from neck  . Wisdom tooth extraction    . Oral mucocele excision  2007    of lip  . Dilation and evacuation  03/19/2012    Procedure: DILATATION AND EVACUATION;  Surgeon: Floyce Stakes. Pamala Hurry, MD;  Location: [facility];  Service: Gynecology;  Laterality: N/A;  With Ultrasound  . Salivary gland removal  2009    Family History  Problem Relation Age of Onset  . Other Neg Hx   . Migraines Mother   . Hyperlipidemia Mother   . Arthritis Mother   . Heart murmur Mother   . Cancer Maternal Aunt 34    breast  . Heart attack Maternal Grandmother   . Heart disease Maternal Grandfather   . Hyperlipidemia Paternal Grandmother   . Heart attack Paternal Grandfather   . Glaucoma Father     History   Social History  . Marital Status: Married    Spouse Name: N/A    Number of Children: N/A  . Years of Education: N/A   Occupational History  . Not on file.    Social History Main Topics  . Smoking status: Never Smoker   . Smokeless tobacco: Never Used  . Alcohol Use: No     Comment: occasional EtOH before pregnancy  . Drug Use: No  . Sexual Activity: Yes    Birth Control/ Protection: None   Other Topics Concern  . Not on file   Social History Narrative   Married, 2 children   Exercise: minimal   Diet: Healthy   Review of Systems No rash No vomiting or diarrhea Appetite is okay---taste is off though Left eye with gunk this AM     Objective:   Physical Exam  Constitutional: She appears well-developed and well-nourished. No distress.  HENT:  Mild maxillary and frontal sinus tenderness TMs fine Moderate nasal inflammation  Neck: Normal range of motion. Neck supple.  Pulmonary/Chest: Effort normal and breath sounds normal. No respiratory distress. She has no wheezes. She has no rales.  Lymphadenopathy:    She has no cervical adenopathy.          Assessment & Plan:

## 2014-04-10 ENCOUNTER — Encounter: Payer: Self-pay | Admitting: Family Medicine

## 2014-04-10 ENCOUNTER — Ambulatory Visit (INDEPENDENT_AMBULATORY_CARE_PROVIDER_SITE_OTHER): Payer: 59 | Admitting: Family Medicine

## 2014-04-10 VITALS — BP 120/84 | HR 61 | Temp 97.6°F | Ht 65.0 in | Wt 149.8 lb

## 2014-04-10 DIAGNOSIS — H938X9 Other specified disorders of ear, unspecified ear: Secondary | ICD-10-CM

## 2014-04-10 DIAGNOSIS — H938X2 Other specified disorders of left ear: Secondary | ICD-10-CM

## 2014-04-10 NOTE — Progress Notes (Signed)
Dr. Frederico Hamman T. Marcell Pfeifer, MD, [facility] [place], [address] Phone: [phone] Fax: [phone]  04/10/2014  Patient: [name], MRN: [id number], DOB: 10-11-1980, 33 y.o.  Primary Physician:  Eliezer Lofts, MD  Chief Complaint: Feels like ear is pulsating  Subjective:   [name] is a 33 y.o. very pleasant female patient who presents with the following:  Will come and go, pulsating and then it will stop. On estrogen, on IVF. Then Lupron.   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 No recent ear infections that she is aware of. No significant allergies, but has had a little before.   Past Medical History, Surgical History, Social History, Family History, Problem List, Medications, and Allergies have been reviewed and updated if relevant.  ROS: GEN: Acute illness details above GI: Tolerating PO intake GU: maintaining adequate hydration and urination Pulm: No SOB Interactive and getting along well at home.  Otherwise, ROS is as per the HPI.   Objective:   BP 120/84  Pulse 61  Temp(Src) 97.6 F (36.4 C) (Oral)  Ht 5\' 5"  (1.651 m)  Wt 149 lb 12 oz (67.926 kg)  BMI 24.92 kg/m2  LMP 03/27/2014  Breastfeeding? No   GEN: WDWN, NAD, Non-toxic, A & O x 3 HEENT: Atraumatic, Normocephalic. Neck supple. No masses, No LAD. Ears and Nose: No external deformity. TM clear on R, L TM with serous fluid CV: RRR, No M/G/R. No JVD. No thrill. No extra heart sounds. PULM: CTA B, no wheezes, crackles, rhonchi. No retractions. No resp. distress. No accessory muscle use. EXTR: No c/c/e NEURO Normal gait.  PSYCH: Normally interactive. Conversant. Not depressed or anxious appearing.  Calm demeanor.     Laboratory and Imaging Data:  Assessment and Plan:   Ear fullness, left  Refer to the patient instructions sections for details of plan shared with patient.   Patient  Instructions  There is a tube that connects between the sinuses and behind the ear called the "eustachian tube." Sometimes when you have allergies, a cold, or nasal congestion for any reason this tube can get blocked and pressure cannot equalize in your ears. (Like if you swim in deep water) This can also trap fluid behind the ear and give you a full, pressure-like sensation that is uncomfortable, but it is not an ear infection.  Recommendations: Afrin Nasal Spray: 2 sprays twice a day for a maximum of 3-4 days (longer than this and your nose gets addicted, and you have rebound swelling that makes it worse.) Sudafed: Either pseudoephedrine or phenylephrine. As directed on box. (Not is heart problems or high blood pressure) Anti-Histamine: Allegra, Zyrtec, or Claritin. All over the counter now and once a day.  Nasal steroid. Nasacort is over the counter now. About 10 prescription ones exist.  If you develop fever > 100.4, then things can change fluid behind the ear does increase your risk of developing an ear infection.      Signed,  Maud Deed. Hillard Goodwine, MD   Patient's Medications  New Prescriptions   No medications on file  Previous Medications   IBUPROFEN (ADVIL,MOTRIN) 200 MG TABLET    Take 200 mg by mouth every 6 (six) hours as needed.   PRENATAL VIT-FE FUMARATE-FA (PRENATAL MULTIVITAMIN) TABS TABLET    Take 1 tablet by mouth daily at 12 noon.  Modified Medications   No medications on file  Discontinued Medications  AMOXICILLIN (AMOXIL) 500 MG TABLET    Take 2 tablets (1,000 mg total) by mouth 2 (two) times daily.   PSEUDOEPHEDRINE (SUDAFED) 30 MG TABLET    Take 60 mg by mouth every 4 (four) hours as needed for congestion.

## 2014-04-10 NOTE — Progress Notes (Signed)
Pre visit review using our clinic review tool, if applicable. No additional management support is needed unless otherwise documented below in the visit note. 

## 2014-04-10 NOTE — Patient Instructions (Signed)
There is a tube that connects between the sinuses and behind the ear called the "eustachian tube." Sometimes when you have allergies, a cold, or nasal congestion for any reason this tube can get blocked and pressure cannot equalize in your ears. (Like if you swim in deep water) This can also trap fluid behind the ear and give you a full, pressure-like sensation that is uncomfortable, but it is not an ear infection.  Recommendations: Afrin Nasal Spray: 2 sprays twice a day for a maximum of 3-4 days (longer than this and your nose gets addicted, and you have rebound swelling that makes it worse.) Sudafed: Either pseudoephedrine or phenylephrine. As directed on box. (Not is heart problems or high blood pressure) Anti-Histamine: Allegra, Zyrtec, or Claritin. All over the counter now and once a day.  Nasal steroid. Nasacort is over the counter now. About 10 prescription ones exist.  If you develop fever > 100.4, then things can change fluid behind the ear does increase your risk of developing an ear infection.

## 2014-05-13 ENCOUNTER — Telehealth: Payer: Self-pay | Admitting: Family Medicine

## 2014-05-13 MED ORDER — IVERMECTIN 0.5 % EX LOTN: TOPICAL_LOTION | CUTANEOUS | Status: DC

## 2014-05-13 NOTE — Telephone Encounter (Signed)
Please fax to pharmacy, directions to long

## 2014-05-13 NOTE — Telephone Encounter (Signed)
Pt left v/m requesting status of med being sent to pharmacy. Pt request cb.

## 2014-05-13 NOTE — Telephone Encounter (Signed)
Spoke with Janett Billow.  She just needs this medication for her and her husband who is a patient here.  Pediatrician has already sent in Rx for her children.

## 2014-05-13 NOTE — Telephone Encounter (Signed)
Will send into pharm with multple tubes for family members if they are pts here. Get number of family members  And age please.  If not patients here they need to call PCP.  Nit combing is not required, although a fine-tooth comb may be used to remove treated lice and nits. Lotion is for one-time use;  Ivermectin should be a portion of a whole lice removal program, which should include washing or dry cleaning all clothing, hats, bedding, and towels recently worn or used by the patient and washing combs, brushes, and hair accessories in hot soapy water.

## 2014-05-13 NOTE — Telephone Encounter (Signed)
Patient Information:  Caller Name: Livie  Phone: [phone]  Patient: [name], [name]  Gender: Female  DOB: [birth year]  Age: 33 Years  PCP: Eliezer Lofts (Family Practice)  Pregnant: No  Office Follow Up:  Does the office need to follow up with this patient?: Yes  Instructions For The Office: Requesting Rx for [facility] for herself and husband: Niyla Marone 10-May-1981. Daughter has resistant strain of Head Lice and wants to treat whole family since they have all felt itchy on scalp. No live lice seen- except on daughter.  RN Note:  Uses [facility]  Symptoms  Reason For Call & Symptoms: Daughter has been treated for head lice and letter from school has indicated that it is a resistant form to OTC Treatments. It was suggested in the letter that Rx for [facility] be obtained from [facility] doctor and everyone in the home be checked /treated if having sx. Requesting Rx for SKlice.  Reviewed Health History In EMR: Yes  Reviewed Medications In EMR: Yes  Reviewed Allergies In EMR: Yes  Reviewed Surgeries / Procedures: Yes  Date of Onset of Symptoms: 05/13/2014 OB / GYN:  LMP: Unknown  Guideline(s) Used:  Itching - Localized  No Protocol Available - Information Only  Disposition Per Guideline:   Discuss with PCP and Callback by Nurse Today  Reason For Disposition Reached:   Nursing judgment  Advice Given:  Call Back If:  New symptoms develop  You become worse.  Patient Will Follow Care Advice:  YES

## 2014-05-13 NOTE — Telephone Encounter (Signed)
Faxed to [facility] [phone].

## 2014-06-09 ENCOUNTER — Encounter: Payer: Self-pay | Admitting: Family Medicine

## 2014-07-02 ENCOUNTER — Telehealth: Payer: Self-pay | Admitting: Family Medicine

## 2014-07-02 MED ORDER — IVERMECTIN 0.5 % EX LOTN: TOPICAL_LOTION | CUTANEOUS | Status: DC

## 2014-07-02 NOTE — Telephone Encounter (Signed)
Spoke with wife and [name], ok to refill

## 2014-07-02 NOTE — Telephone Encounter (Signed)
[name]/patient Phone [phone]: Called to request Rx for SK-Lice be called for herself (and husband who sees Dr Silvio Pate) to [place].  Daughter diagnosed with head lice by PCP 50/93/26; Pediatrician  suggested treating entire family at same time.  Pt is asymptomatic.  LMP 06/14/14.  Informed Dr Diona Browner is out of the office; another provider will review the Rx request and determine if Rx can be called in.  Please call only if on call provider declines Rx request.

## 2014-07-02 NOTE — Telephone Encounter (Signed)
Use RID which is OTC 

## 2014-07-02 NOTE — Addendum Note (Signed)
Addended by: Despina Hidden on: 07/02/2014 02:21 PM   Modules accepted: Orders

## 2014-08-08 NOTE — L&D Delivery Note (Signed)
Delivery Note At 6:09 PM a healthy female was delivered via Vaginal, Spontaneous Delivery (Presentation: Ant Occiput  ).  APGAR: 7, 8; weight pending.   Placenta status: Abruptio simultaneously with delivery of baby.  Intact, Spontaneous.  Cord: 3 vessels with the following complications: 1 tight Dawson clamped and cut before delivery of the shoulders.  Cord pH: Not done  Anesthesia: Epidural  Episiotomy: None Lacerations: 2nd degree;Perineal Suture Repair: 2.0 3.0 vicryl vicryl rapide Est. Blood Loss (mL):  About 700 cc  Mom to postpartum.  Baby to Couplet care / Skin to Skin.  Greg Eckrich,MARIE-LYNE 03/18/2015, 6:44 PM

## 2014-08-29 LAB — OB RESULTS CONSOLE HIV ANTIBODY (ROUTINE TESTING): HIV: NONREACTIVE

## 2014-08-29 LAB — OB RESULTS CONSOLE RPR: RPR: NONREACTIVE

## 2014-08-31 ENCOUNTER — Inpatient Hospital Stay (HOSPITAL_COMMUNITY)
Admission: AD | Admit: 2014-08-31 | Discharge: 2014-08-31 | Disposition: A | Discharge status: Home / Self Care | Payer: 59 | Source: Ambulatory Visit | Attending: Obstetrics and Gynecology | Admitting: Obstetrics and Gynecology

## 2014-08-31 ENCOUNTER — Encounter (HOSPITAL_COMMUNITY): Payer: Self-pay

## 2014-08-31 DIAGNOSIS — Z3A10 10 weeks gestation of pregnancy: Secondary | ICD-10-CM | POA: Diagnosis not present

## 2014-08-31 DIAGNOSIS — O209 Hemorrhage in early pregnancy, unspecified: Secondary | ICD-10-CM | POA: Diagnosis not present

## 2014-08-31 DIAGNOSIS — O26851 Spotting complicating pregnancy, first trimester: Secondary | ICD-10-CM | POA: Diagnosis present

## 2014-08-31 LAB — PROGESTERONE: Progesterone: 8.3 ng/mL

## 2014-08-31 MED ORDER — RHO D IMMUNE GLOBULIN 1500 UNIT/2ML IJ SOSY
300.0000 ug | PREFILLED_SYRINGE | Freq: Once | INTRAMUSCULAR | Status: AC
  Administered 2014-08-31: 300 ug via INTRAMUSCULAR
  Filled 2014-08-31: qty 2

## 2014-08-31 NOTE — MAU Provider Note (Signed)
History     CSN: [id number]  Arrival date and time: 08/31/14 1057   None     Chief Complaint  Patient presents with  . Vaginal Bleeding   HPI  Talked with CNM this morning with c/o [redacted] weeks pregnant with pink, red, brown spotting. Instructed to come to MAU for evaluation - reports woke up with pink spotting on 1/23 when wiping, then brown in the afternoon, then went away. This morning she woke up with increased pink to red discharge. Has had some mild cramping.  Pt. Reports this was an IVF pregnancy, using estrogen tablets/patches until 8 weeks, and was on progesterone injections until 10 weeks.  Has not taken any other medications. Per patient, last intercourse was several weeks ago. Reports 2 previous term vaginal deliveries with no complications.     OB History    Gravida Para Term Preterm AB TAB SAB Ectopic Multiple Living   3 2 2       2       Past Medical History  Diagnosis Date  . Anemia   . Pregnancy induced hypertension   . Female infertility of unspecified origin   . Heart murmur   . H/O varicella   . Headache(784.0)     migraine    Past Surgical History  Procedure Laterality Date  . Soft tissue cyst excision  2009    cyst removed from neck  . Wisdom tooth extraction    . Oral mucocele excision  2007    of lip  . Dilation and evacuation  03/19/2012    Procedure: DILATATION AND EVACUATION;  Surgeon: Floyce Stakes. Pamala Hurry, MD;  Location: [facility];  Service: Gynecology;  Laterality: N/A;  With Ultrasound  . Salivary gland removal  2009    Family History  Problem Relation Age of Onset  . Other Neg Hx   . Migraines Mother   . Hyperlipidemia Mother   . Arthritis Mother   . Heart murmur Mother   . Cancer Maternal Aunt 34    breast  . Heart attack Maternal Grandmother   . Heart disease Maternal Grandfather   . Hyperlipidemia Paternal Grandmother   . Heart attack Paternal Grandfather   . Glaucoma Father     History  Substance Use Topics  . Smoking status:  Never Smoker   . Smokeless tobacco: Never Used  . Alcohol Use: No     Comment: occasional EtOH before pregnancy    Allergies:  Allergies  Allergen Reactions  . Sulfa Antibiotics Itching    Reaction to Sulfa Eye Drops    Prescriptions prior to admission  Medication Sig Dispense Refill Last Dose  . ibuprofen (ADVIL,MOTRIN) 200 MG tablet Take 200 mg by mouth every 6 (six) hours as needed.   Not Taking at Unknown time  . Ivermectin 0.5 % LOTN Apply 1 tube to dry scalp and hair closest to scalp first, then apply outward towards ends of hair; completely covering scalp and hair. Leave on for 10 minutes (start timing treatment after the scalp and hair have been completely covered). The hair should then be rinsed thoroughly with warm water. Avoid contact with the eyes. (Patient not taking: Reported on 08/31/2014) 117 g 0 Not Taking at Unknown time  . Prenatal Vit-Fe Fumarate-FA (PRENATAL MULTIVITAMIN) TABS tablet Take 1 tablet by mouth daily at 12 noon.   08/30/2014 at 2200    ROS  ROS: General: denies fever, chills, weight loss, weight gain Abdomen: reports mild cramping, sharp pains GI: denies nausea, vomiting,  diarrhea, constipation GU: small brown then pink-red discharge x 2 days when wiping, denies vaginal discharge, denies odor, denies dysuria, denies hematuria   Physical Exam   Blood pressure 133/80, pulse 61, temperature 98 F (36.7 C), temperature source Oral, resp. rate 18, last menstrual period 06/14/2014.  Physical Exam  General: alert, oriented x 2, in no acute distress Heart: RRR Lungs: clear, equal bilaterally Abdomen: non-tender Speculum: small brown discharge noted around cervix.   Cervix was visualized - pink, appeared closed, not friable, no active bleeding Bimanual: no CMT, uterus feels grapefruit size - consistent with a 10 week uterus, cervix was closed, long, thick  FHR by doptone: 178bmp  History of Rh-negative: Rhogam work-up pending  MAU Course   Procedures  Rhogam work-up pending   Assessment and Plan  First trimester bleeding  Confirm viability Rhogam injection prior to discharge Keep appointment Thursday Miscarriage signs/symptoms to call Pelvic rest  Discharge home  Kassie Mends 08/31/2014, 12:22 PM

## 2014-08-31 NOTE — MAU Note (Signed)
Pt presents complaining of light pink spotting x2 days. IVF pregnancy. Complains of abdominal cramping. Denies other discharge.

## 2014-08-31 NOTE — Discharge Instructions (Signed)
Vaginal Bleeding During Pregnancy, First Trimester A small amount of bleeding (spotting) from the vagina is common in early pregnancy. Sometimes the bleeding is normal and is not a problem, and sometimes it is a sign of something serious like a miscarriage. Be sure to tell your doctor about any bleeding from your vagina right away. HOME CARE  Watch your condition for any changes.  Follow your doctor's instructions about how active you can be.  Do not use tampons.  Do not douche.  Do not have sex or orgasms until your doctor says it is okay.  If you pass any tissue from your vagina, save the tissue so you can show it to your doctor.  Only take medicines as told by your doctor.  Do not take aspirin because it can make you bleed.  Keep all follow-up visits as told by your doctor. GET HELP IF:   You have bright red blood like your period from your vagina.  You have cramps.  You have labor pains.  You have a fever that does not go away after you take medicine. GET HELP RIGHT AWAY IF:   You have very bad cramps in your back or belly (abdomen).  You pass large clots or tissue from your vagina.  You have heavy bleeding.  You feel light-headed or weak.  You pass out (faint).  You have chills.  You pass out while pooping (having a bowel movement). MAKE SURE YOU:  Understand these instructions.  Will watch your condition.  Will get help right away if you are not doing well or get worse. Document Released: 12/09/2013 Document Reviewed: 04/01/2013 [facility] Patient Information 2015 [employer]. This information is not intended to replace advice given to you by your health care provider. Make sure you discuss any questions you have with your health care provider.

## 2014-08-31 NOTE — Progress Notes (Signed)
Notified of pt arrival in MAU and +FHT

## 2014-09-01 LAB — RH IG WORKUP (INCLUDES ABO/RH)
ABO/RH(D): A NEG
Antibody Screen: NEGATIVE
Gestational Age(Wks): 10
Unit division: 0

## 2015-02-27 LAB — OB RESULTS CONSOLE GBS: GBS: NEGATIVE

## 2015-03-18 ENCOUNTER — Inpatient Hospital Stay (HOSPITAL_COMMUNITY): Payer: 59 | Admitting: Anesthesiology

## 2015-03-18 ENCOUNTER — Inpatient Hospital Stay (HOSPITAL_COMMUNITY)
Admission: AD | Admit: 2015-03-18 | Discharge: 2015-03-20 | DRG: 775 | Disposition: A | Discharge status: Home / Self Care | Payer: 59 | Source: Ambulatory Visit | Attending: Obstetrics & Gynecology | Admitting: Obstetrics & Gynecology

## 2015-03-18 ENCOUNTER — Encounter (HOSPITAL_COMMUNITY): Payer: Self-pay | Admitting: *Deleted

## 2015-03-18 DIAGNOSIS — Z3A39 39 weeks gestation of pregnancy: Secondary | ICD-10-CM | POA: Diagnosis present

## 2015-03-18 DIAGNOSIS — O133 Gestational [pregnancy-induced] hypertension without significant proteinuria, third trimester: Secondary | ICD-10-CM | POA: Diagnosis present

## 2015-03-18 DIAGNOSIS — O139 Gestational [pregnancy-induced] hypertension without significant proteinuria, unspecified trimester: Secondary | ICD-10-CM | POA: Diagnosis present

## 2015-03-18 DIAGNOSIS — O9962 Diseases of the digestive system complicating childbirth: Secondary | ICD-10-CM | POA: Diagnosis present

## 2015-03-18 DIAGNOSIS — O09523 Supervision of elderly multigravida, third trimester: Secondary | ICD-10-CM | POA: Diagnosis not present

## 2015-03-18 DIAGNOSIS — K219 Gastro-esophageal reflux disease without esophagitis: Secondary | ICD-10-CM | POA: Diagnosis present

## 2015-03-18 DIAGNOSIS — Z8249 Family history of ischemic heart disease and other diseases of the circulatory system: Secondary | ICD-10-CM

## 2015-03-18 DIAGNOSIS — O26851 Spotting complicating pregnancy, first trimester: Secondary | ICD-10-CM

## 2015-03-18 LAB — COMPREHENSIVE METABOLIC PANEL
ALBUMIN: 3.2 g/dL — AB (ref 3.5–5.0)
ALK PHOS: 217 U/L — AB (ref 38–126)
ALT: 22 U/L (ref 14–54)
AST: 26 U/L (ref 15–41)
Anion gap: 8 (ref 5–15)
BILIRUBIN TOTAL: 0.7 mg/dL (ref 0.3–1.2)
BUN: 9 mg/dL (ref 6–20)
CHLORIDE: 106 mmol/L (ref 101–111)
CO2: 21 mmol/L — ABNORMAL LOW (ref 22–32)
Calcium: 8.6 mg/dL — ABNORMAL LOW (ref 8.9–10.3)
Creatinine, Ser: 0.64 mg/dL (ref 0.44–1.00)
GFR calc Af Amer: >60 mL/min (ref >60)
Glucose, Bld: 95 mg/dL (ref 65–99)
POTASSIUM: 3.6 mmol/L (ref 3.5–5.1)
SODIUM: 135 mmol/L (ref 135–145)
TOTAL PROTEIN: 6.8 g/dL (ref 6.5–8.1)

## 2015-03-18 LAB — CBC
HCT: 36.4 % (ref 36.0–46.0)
HCT: 33.7 % — ABNORMAL LOW (ref 36.0–46.0)
HEMOGLOBIN: 12.7 g/dL (ref 12.0–15.0)
HEMOGLOBIN: 11.8 g/dL — AB (ref 12.0–15.0)
MCH: 30.3 pg (ref 26.0–34.0)
MCH: 30.5 pg (ref 26.0–34.0)
MCHC: 34.9 g/dL (ref 30.0–36.0)
MCHC: 35 g/dL (ref 30.0–36.0)
MCV: 86.9 fL (ref 78.0–100.0)
MCV: 87.1 fL (ref 78.0–100.0)
Platelets: 161 10*3/uL (ref 150–400)
Platelets: 182 10*3/uL (ref 150–400)
RBC: 4.19 MIL/uL (ref 3.87–5.11)
RBC: 3.87 MIL/uL (ref 3.87–5.11)
RDW: 12.9 % (ref 11.5–15.5)
RDW: 13 % (ref 11.5–15.5)
WBC: 8.8 10*3/uL (ref 4.0–10.5)
WBC: 18.1 10*3/uL — ABNORMAL HIGH (ref 4.0–10.5)

## 2015-03-18 LAB — LACTATE DEHYDROGENASE: LDH: 134 U/L (ref 98–192)

## 2015-03-18 LAB — URIC ACID: URIC ACID, SERUM: 4.2 mg/dL (ref 2.3–6.6)

## 2015-03-18 LAB — PROTEIN / CREATININE RATIO, URINE
Creatinine, Urine: 133 mg/dL
Protein Creatinine Ratio: 0.26 mg/mg{Cre} — ABNORMAL HIGH (ref 0.00–0.15)
Total Protein, Urine: 34 mg/dL

## 2015-03-18 MED ORDER — LIDOCAINE HCL (PF) 1 % IJ SOLN
30.0000 mL | INTRAMUSCULAR | Status: DC | PRN
  Administered 2015-03-18: 30 mL via SUBCUTANEOUS
  Filled 2015-03-18: qty 30

## 2015-03-18 MED ORDER — PRENATAL MULTIVITAMIN CH
1.0000 | ORAL_TABLET | Freq: Every day | ORAL | Status: DC
  Administered 2015-03-19: 1 via ORAL
  Filled 2015-03-18: qty 1

## 2015-03-18 MED ORDER — ACETAMINOPHEN 325 MG PO TABS: 650.0000 mg | ORAL_TABLET | ORAL | Status: DC | PRN

## 2015-03-18 MED ORDER — DIBUCAINE 1 % RE OINT: 1.0000 "application " | TOPICAL_OINTMENT | RECTAL | Status: DC | PRN

## 2015-03-18 MED ORDER — OXYTOCIN 40 UNITS IN LACTATED RINGERS INFUSION - SIMPLE MED: 62.5000 mL/h | INTRAVENOUS | Status: DC

## 2015-03-18 MED ORDER — FENTANYL 2.5 MCG/ML BUPIVACAINE 1/10 % EPIDURAL INFUSION (WH - ANES): 14.0000 mL/h | INTRAMUSCULAR | Status: DC | PRN

## 2015-03-18 MED ORDER — ZOLPIDEM TARTRATE 5 MG PO TABS: 5.0000 mg | ORAL_TABLET | Freq: Every evening | ORAL | Status: DC | PRN

## 2015-03-18 MED ORDER — DIPHENHYDRAMINE HCL 50 MG/ML IJ SOLN: 12.5000 mg | INTRAMUSCULAR | Status: DC | PRN

## 2015-03-18 MED ORDER — LANOLIN HYDROUS EX OINT: TOPICAL_OINTMENT | CUTANEOUS | Status: DC | PRN

## 2015-03-18 MED ORDER — OXYTOCIN BOLUS FROM INFUSION: 500.0000 mL | INTRAVENOUS | Status: DC

## 2015-03-18 MED ORDER — OXYTOCIN 40 UNITS IN LACTATED RINGERS INFUSION - SIMPLE MED
1.0000 m[IU]/min | INTRAVENOUS | Status: DC
  Administered 2015-03-18: 2 m[IU]/min via INTRAVENOUS
  Filled 2015-03-18: qty 1000

## 2015-03-18 MED ORDER — LACTATED RINGERS IV SOLN
INTRAVENOUS | Status: DC
  Administered 2015-03-18 (×2): via INTRAVENOUS

## 2015-03-18 MED ORDER — OXYCODONE-ACETAMINOPHEN 5-325 MG PO TABS: 1.0000 | ORAL_TABLET | ORAL | Status: DC | PRN

## 2015-03-18 MED ORDER — DIPHENHYDRAMINE HCL 25 MG PO CAPS: 25.0000 mg | ORAL_CAPSULE | Freq: Four times a day (QID) | ORAL | Status: DC | PRN

## 2015-03-18 MED ORDER — CITRIC ACID-SODIUM CITRATE 334-500 MG/5ML PO SOLN: 30.0000 mL | ORAL | Status: DC | PRN

## 2015-03-18 MED ORDER — OXYCODONE-ACETAMINOPHEN 5-325 MG PO TABS: 2.0000 | ORAL_TABLET | ORAL | Status: DC | PRN

## 2015-03-18 MED ORDER — BENZOCAINE-MENTHOL 20-0.5 % EX AERO: 1.0000 "application " | INHALATION_SPRAY | CUTANEOUS | Status: DC | PRN

## 2015-03-18 MED ORDER — SIMETHICONE 80 MG PO CHEW: 80.0000 mg | CHEWABLE_TABLET | ORAL | Status: DC | PRN

## 2015-03-18 MED ORDER — OXYTOCIN 40 UNITS IN LACTATED RINGERS INFUSION - SIMPLE MED: 62.5000 mL/h | INTRAVENOUS | Status: DC | PRN

## 2015-03-18 MED ORDER — LIDOCAINE HCL (PF) 1 % IJ SOLN
INTRAMUSCULAR | Status: DC | PRN
  Administered 2015-03-18: 6 mL via EPIDURAL
  Administered 2015-03-18: 4 mL

## 2015-03-18 MED ORDER — SENNOSIDES-DOCUSATE SODIUM 8.6-50 MG PO TABS
2.0000 | ORAL_TABLET | ORAL | Status: DC
Start: 2015-03-19 — End: 2015-03-20
  Administered 2015-03-19: 2 via ORAL
  Filled 2015-03-18 (×2): qty 2

## 2015-03-18 MED ORDER — WITCH HAZEL-GLYCERIN EX PADS: 1.0000 "application " | MEDICATED_PAD | CUTANEOUS | Status: DC | PRN

## 2015-03-18 MED ORDER — TERBUTALINE SULFATE 1 MG/ML IJ SOLN
0.2500 mg | Freq: Once | INTRAMUSCULAR | Status: DC | PRN
  Filled 2015-03-18: qty 1

## 2015-03-18 MED ORDER — ONDANSETRON HCL 4 MG/2ML IJ SOLN: 4.0000 mg | INTRAMUSCULAR | Status: DC | PRN

## 2015-03-18 MED ORDER — ONDANSETRON HCL 4 MG/2ML IJ SOLN: 4.0000 mg | Freq: Four times a day (QID) | INTRAMUSCULAR | Status: DC | PRN

## 2015-03-18 MED ORDER — IBUPROFEN 600 MG PO TABS
600.0000 mg | ORAL_TABLET | Freq: Four times a day (QID) | ORAL | Status: DC
  Administered 2015-03-19 – 2015-03-20 (×6): 600 mg via ORAL
  Filled 2015-03-18 (×6): qty 1

## 2015-03-18 MED ORDER — LACTATED RINGERS IV SOLN
500.0000 mL | INTRAVENOUS | Status: DC | PRN
Start: 2015-03-18 — End: 2015-03-18

## 2015-03-18 MED ORDER — PHENYLEPHRINE 40 MCG/ML (10ML) SYRINGE FOR IV PUSH (FOR BLOOD PRESSURE SUPPORT)
80.0000 ug | PREFILLED_SYRINGE | INTRAVENOUS | Status: DC | PRN
  Filled 2015-03-18: qty 20
  Filled 2015-03-18: qty 2

## 2015-03-18 MED ORDER — OXYCODONE-ACETAMINOPHEN 5-325 MG PO TABS
2.0000 | ORAL_TABLET | ORAL | Status: DC | PRN
Start: 2015-03-18 — End: 2015-03-18

## 2015-03-18 MED ORDER — EPHEDRINE 5 MG/ML INJ
10.0000 mg | INTRAVENOUS | Status: DC | PRN
  Filled 2015-03-18: qty 2

## 2015-03-18 MED ORDER — TETANUS-DIPHTH-ACELL PERTUSSIS 5-2.5-18.5 LF-MCG/0.5 IM SUSP
0.5000 mL | Freq: Once | INTRAMUSCULAR | Status: DC
Start: 2015-03-19 — End: 2015-03-20

## 2015-03-18 MED ORDER — ONDANSETRON HCL 4 MG PO TABS: 4.0000 mg | ORAL_TABLET | ORAL | Status: DC | PRN

## 2015-03-18 MED ORDER — FENTANYL 2.5 MCG/ML BUPIVACAINE 1/10 % EPIDURAL INFUSION (WH - ANES)
14.0000 mL/h | INTRAMUSCULAR | Status: DC | PRN
  Administered 2015-03-18: 14 mL/h via EPIDURAL
  Filled 2015-03-18: qty 125

## 2015-03-18 NOTE — Anesthesia Procedure Notes (Signed)
Epidural Patient location during procedure: OB  Preanesthetic Checklist Completed: patient identified, site marked, surgical consent, pre-op evaluation, timeout performed, IV checked, risks and benefits discussed and monitors and equipment checked  Epidural Patient position: sitting Prep: site prepped and draped and DuraPrep Patient monitoring: continuous pulse ox and blood pressure Approach: midline Location: L3-L4 Injection technique: LOR air  Needle:  Needle type: Tuohy  Needle gauge: 17 G Needle length: 9 cm and 9 Needle insertion depth: 4 cm Catheter type: closed end flexible Catheter size: 19 Gauge Catheter at skin depth: 10 cm Test dose: negative  Assessment Events: blood not aspirated, injection not painful, no injection resistance, negative IV test and no paresthesia  Additional Notes Dosing of Epidural:  1st dose, through catheter ............................................Marland Kitchen  Xylocaine 40 mg  2nd dose, through catheter, after waiting 3 minutes........Marland KitchenXylocaine 60 mg    As each dose occurred, patient was free of IV sx; and patient exhibited no evidence of SA injection.  Patient is more comfortable after epidural dosed. Please see RN's note for documentation of vital signs,and FHR which are stable.  Patient reminded not to try to ambulate with numb legs, and that an RN must be present when she attempts to get up.

## 2015-03-18 NOTE — Progress Notes (Signed)
Dr. Dellis Filbert informed pt now 4cms, has epidural, FHR tracing with variables& moderate LTV since epidural insertion.

## 2015-03-18 NOTE — H&P (Signed)
[name] is a 34 y.o. G3P2002 at [redacted]w[redacted]d presenting for IOL due to gest htn. Pt notes no contractions. Good fetal movement, No vaginal bleeding, not leaking fluid. No chest pain, no SOB, no RUQ pain. Pt reports no edema, no HA, no vision change. Pt does have some sinus pressure over the past few days. Pt with h/o gest htn w/o PEC in 2 prior pregnancies and has been monitoring home bps. Pt called overnight and again this am with bps 140-150/80-90 at home. Admitted for IOL  [facility] at [facility] since 7wks - Dated by IVF and 5 and 7 wk u/s - h/o gest htn in 2 prior pregnancies - baseline low nl plts in 160s - h/o PPH and retained placenta- G1- PPH, G2- retained placenta needing manual extraction in OR - h/o carpal tunnel syndrome in preg - GERD   Prenatal Transfer Tool  Maternal Diabetes: No Genetic Screening: Normal Maternal Ultrasounds/Referrals: Normal Fetal Ultrasounds or other Referrals:  None Maternal Substance Abuse:  No Significant Maternal Medications:  None Significant Maternal Lab Results: None     OB History    Gravida Para Term Preterm AB TAB SAB Ectopic Multiple Living   3 2 2       2      Past Medical History  Diagnosis Date  . Anemia   . Pregnancy induced hypertension   . Female infertility of unspecified origin   . Heart murmur   . H/O varicella   . Headache(784.0)     migraine   Past Surgical History  Procedure Laterality Date  . Soft tissue cyst excision  2009    cyst removed from neck  . Wisdom tooth extraction    . Oral mucocele excision  2007    of lip  . Dilation and evacuation  03/19/2012    Procedure: DILATATION AND EVACUATION;  Surgeon: Floyce Stakes. Amanda Hurry, MD;  Location: [facility];  Service: Gynecology;  Laterality: N/A;  With Ultrasound  . Salivary gland removal  2009   Family History: family history includes Arthritis in her mother; Cancer (age of onset: 61) in her maternal aunt; Glaucoma in her father; Heart attack in her maternal  grandmother and paternal grandfather; Heart disease in her maternal grandfather; Heart murmur in her mother; Hyperlipidemia in her mother and paternal grandmother; Migraines in her mother. There is no history of Other. Social History:  reports that she has never smoked. She has never used smokeless tobacco. She reports that she does not drink alcohol or use illicit drugs.  Review of Systems - Negative except high bp's at home without symptoms   Dilation: 1 Effacement (%): 60 Station: -2, -3 Exam by:: [name], RNC Blood pressure 120/79, pulse 68, temperature 98.2 F (36.8 C), temperature source Oral, resp. rate 18, height 5\' 6"  (1.676 m), weight 81.647 kg (180 lb), last menstrual period 06/14/2014.  Physical Exam:   Filed Vitals:   03/18/15 1128 03/18/15 1215 03/18/15 1245  BP: 132/91 133/85 120/79  Pulse: 70 68 68  Temp: 98.2 F (36.8 C)    TempSrc: Oral    Resp: 20  18  Height: 5\' 6"  (1.676 m)    Weight: 81.647 kg (180 lb)      Gen: well appearing, no distress Back: no CVAT Abd: gravid, NT, no RUQ pain LE: no edema, equal bilaterally, non-tender, 3+ DTR Toco: none FH: baseline 140s, accelerations present, no deceleratons, 10 beat variability  Prenatal labs: ABO, Rh: --/--/A NEG (08/10 1040) Antibody: PENDING (08/10 1040)  Rubella:  immune RPR: Nonreactive (01/22 0000)  HBsAg:   neg HIV: Non-reactive (01/22 0000)  GBS: Negative (07/22 0000)  1 hr Glucola normal  Genetic screening nl NT, nl AFP Anatomy US nl   Assessment/Plan: 34 y.o. G3P2002 at [redacted]w[redacted]d Gest htn at 39 wks, recc move to delivery. Pt agrees. Pitocin IOL. Quick labors in past Planning epidural (moved to quick for epidural as G2) Watch for [place]   [name] A. 03/18/2015, 1:21 PM

## 2015-03-18 NOTE — Progress Notes (Signed)
EFM removed for epidural insertion.  Category 1 tracing noted

## 2015-03-18 NOTE — Progress Notes (Signed)
Subjective: [redacted] wks Gestational HTN.  Doing well, pain mild, UCs q3-5 min mild  Anesthesia none   Objective: BP 160/89 mmHg  Pulse 69  Temp(Src) 98.2 F (36.8 C) (Oral)  Resp 18  Ht 5\' 6"  (1.676 m)  Wt 180 lb (81.647 kg)  BMI 29.07 kg/m2  LMP 06/14/2014 (Exact Date)   FHT:  FHR: 140's bpm, variability: moderate,  accelerations:  Present,  decelerations:  Absent UC:   regular, every 3-5 minutes VE:   Dilation: 3 Effacement (%): 70 Station: -2 Exam by:: Dr. Dellis Filbert  AROM clear AF  PIH labs wnl  Assessment / Plan: Induction of labor due to gestational hypertension,  progressing well on pitocin/AROM H/O PPH with retention of Placenta  Fetal Wellbeing:  Category I Pain Control:  Epidural PRN  Anticipated MOD:  NSVD  Hedi Barkan,MARIE-LYNE 03/18/2015, 3:06 PM

## 2015-03-18 NOTE — Anesthesia Preprocedure Evaluation (Signed)
Anesthesia Evaluation  Patient identified by MRN, date of birth, ID band Patient awake    Reviewed: Allergy & Precautions, H&P , Patient's Chart, lab work & pertinent test results  Airway Mallampati: II  TM Distance: >3 FB Neck ROM: full    Dental  (+) Teeth Intact   Pulmonary  breath sounds clear to auscultation        Cardiovascular hypertension, Rhythm:regular Rate:Normal     Neuro/Psych    GI/Hepatic   Endo/Other    Renal/GU      Musculoskeletal   Abdominal   Peds  Hematology   Anesthesia Other Findings PIH; Plts 161 from less than 6 hrs ago      Reproductive/Obstetrics (+) Pregnancy                             Anesthesia Physical Anesthesia Plan  ASA: II  Anesthesia Plan: Epidural   Post-op Pain Management:    Induction:   Airway Management Planned:   Additional Equipment:   Intra-op Plan:   Post-operative Plan:   Informed Consent: I have reviewed the patients History and Physical, chart, labs and discussed the procedure including the risks, benefits and alternatives for the proposed anesthesia with the patient or authorized representative who has indicated his/her understanding and acceptance.   Dental Advisory Given  Plan Discussed with:   Anesthesia Plan Comments: (Labs checked- platelets confirmed with RN in room. Fetal heart tracing, per RN, reported to be stable enough for sitting procedure. Discussed epidural, and patient consents to the procedure:  included risk of possible headache,backache, failed block, allergic reaction, and nerve injury. This patient was asked if she had any questions or concerns before the procedure started.)        Anesthesia Quick Evaluation

## 2015-03-18 NOTE — Progress Notes (Signed)
Pt instructed she may begin pushing with ctxs.

## 2015-03-18 NOTE — MAU Note (Signed)
Pt reports her b/p was elevated and sent from the office for evaluation. Denies H/A or visual disturbances no swelling or abd pain.

## 2015-03-18 NOTE — MAU Note (Signed)
Urine in lab 

## 2015-03-19 ENCOUNTER — Encounter (HOSPITAL_COMMUNITY): Payer: Self-pay | Admitting: Obstetrics and Gynecology

## 2015-03-19 LAB — RPR: RPR: NONREACTIVE

## 2015-03-19 LAB — CBC
HCT: 30.3 % — ABNORMAL LOW (ref 36.0–46.0)
Hemoglobin: 10.6 g/dL — ABNORMAL LOW (ref 12.0–15.0)
MCH: 30.4 pg (ref 26.0–34.0)
MCHC: 35 g/dL (ref 30.0–36.0)
MCV: 86.8 fL (ref 78.0–100.0)
PLATELETS: 135 10*3/uL — AB (ref 150–400)
RBC: 3.49 MIL/uL — AB (ref 3.87–5.11)
RDW: 13.1 % (ref 11.5–15.5)
WBC: 11.1 10*3/uL — ABNORMAL HIGH (ref 4.0–10.5)

## 2015-03-19 LAB — CCBB MATERNAL DONOR DRAW

## 2015-03-19 MED ORDER — RHO D IMMUNE GLOBULIN 1500 UNIT/2ML IJ SOSY
300.0000 ug | PREFILLED_SYRINGE | Freq: Once | INTRAMUSCULAR | Status: AC
  Administered 2015-03-19: 300 ug via INTRAMUSCULAR
  Filled 2015-03-19: qty 2

## 2015-03-19 NOTE — Lactation Note (Signed)
This note was copied from the chart of Amanda Paisleigh Maroney. Lactation Consultation Note    With this mom and term baby, now 46 hours old. Mom is an experienced breast feeding mom. She reports breast feeding going well, despite baby being somewhat sleepy. Some basic breastfeeding teaching reviewed with mom, as well as lactation services. Mom knows to call for questions/concerns.   Patient Name: [name] [id number] Date: 03/19/2015     Maternal Data    Feeding    LATCH Score/Interventions                      Lactation Tools Discussed/Used     Consult Status      Tonna Corner 03/19/2015, 1:30 PM

## 2015-03-19 NOTE — Anesthesia Postprocedure Evaluation (Signed)
  Anesthesia Post-op Note  Patient: [name]  Procedure(s) Performed: * No procedures listed *  Patient Location: Mother/Baby  Anesthesia Type:Epidural  Level of Consciousness: awake, alert , oriented and patient cooperative  Airway and Oxygen Therapy: Patient Spontanous Breathing  Post-op Pain: mild  Post-op Assessment: Patient's Cardiovascular Status Stable, Respiratory Function Stable, No headache, No backache and Patient able to bend at knees;ambulating without problem              Post-op Vital Signs: Reviewed and stable  Last Vitals:  Filed Vitals:   03/19/15 0614  BP: 120/59  Pulse: 68  Temp: 36.8 C  Resp: 20    Complications: No apparent anesthesia complications

## 2015-03-19 NOTE — Progress Notes (Signed)
Patient ID: [name], female   DOB: 19-Aug-1980, 34 y.o.   MRN: [id number] PPD # 1 SVD  S:  Reports feeling well, up to take a shower             Tolerating po/ No nausea or vomiting             Bleeding is light             Pain controlled with ibuprofen (OTC)             Up ad lib / ambulatory / voiding without difficulties    Newborn  Information for the patient's newborn:  Alazae, Crymes Girl [name] [[id number]]  female  breast feeding    O:  A & O x 3, in no apparent distress              VS:  Filed Vitals:   03/18/15 2050 03/18/15 2215 03/19/15 0215 03/19/15 0614  BP: 131/74 120/80 127/75 120/59  Pulse: 74 74 67 68  Temp: 98.4 F (36.9 C) 98.5 F (36.9 C) 98.7 F (37.1 C) 98.3 F (36.8 C)  TempSrc: Oral Oral Oral Oral  Resp: 18 18 18 20   Height:      Weight:      SpO2: 99%       LABS:  Recent Labs  03/18/15 1946 03/19/15 0640  WBC 18.1* 11.1*  HGB 11.8* 10.6*  HCT 33.7* 30.3*  PLT 182 135*    Blood type: A NEG (08/10 1040) / Rhophylac indicated, infant Rh POS  Rubella:   Immune   I&O: I/O last 3 completed shifts: In: -  Out: 348 [Blood:348]             Lungs: Clear and unlabored  Heart: regular rate and rhythm / no murmurs  Abdomen: soft, non-tender, non-distended              Fundus: firm, non-tender, U-1  Perineum: 2nd degree repair healing well  Lochia: minimal  Extremities: No edema, no calf pain or tenderness, No Homans    A/P: PPD # 1  34 y.o., [id number]   Principal Problem:    Postpartum care following vaginal delivery (8/10)  Active Problems:    Gestational hypertension - BP WNL and stable    Postpartum state   Doing well - stable status  Routine post partum orders  Anticipate discharge tomorrow    Laury Deep, M, MSN, CNM 03/19/2015, 9:48 AM

## 2015-03-20 LAB — RH IG WORKUP (INCLUDES ABO/RH)
ABO/RH(D): A NEG
Fetal Screen: NEGATIVE
GESTATIONAL AGE(WKS): 39
UNIT DIVISION: 0

## 2015-03-20 MED ORDER — IBUPROFEN 600 MG PO TABS: 600.0000 mg | ORAL_TABLET | Freq: Four times a day (QID) | ORAL | Status: DC

## 2015-03-20 NOTE — Lactation Note (Signed)
This note was copied from the chart of Amanda Jerlean Peralta. Lactation Consultation Note   Experienced BF mother reports that BF is going well.  She denies any concerns prior to discharge.  Hand pump given with instructions for use.  Aware of support groups and outpatient services. Patient Name: [name] [id number] Date: 03/20/2015     Maternal Data    Feeding Feeding Type: Breast Fed Length of feed: 10 min  LATCH Score/Interventions Latch: Grasps breast easily, tongue down, lips flanged, rhythmical sucking.  Audible Swallowing: Spontaneous and intermittent  Type of Nipple: Everted at rest and after stimulation  Comfort (Breast/Nipple): Soft / non-tender     Hold (Positioning): No assistance needed to correctly position infant at breast.  LATCH Score: 10  Lactation Tools Discussed/Used     Consult Status      Van Clines 03/20/2015, 11:15 AM

## 2015-03-20 NOTE — Discharge Summary (Signed)
Obstetric Discharge Summary Reason for Admission: induction of labor Prenatal Procedures: NST and ultrasound Intrapartum Procedures: spontaneous vaginal delivery Postpartum Procedures: none Complications-Operative and Postpartum: 2nd degree perineal laceration HEMOGLOBIN  Date Value Ref Range Status  03/19/2015 10.6* 12.0 - 15.0 g/dL Final   HCT  Date Value Ref Range Status  03/19/2015 30.3* 36.0 - 46.0 % Final    Physical Exam:  General: alert, cooperative, fatigued and no distress Lochia: appropriate Uterine Fundus: firm, midline, U-2 Perineum: healing well, no significant drainage, no dehiscence, no significant erythema DVT Evaluation: No evidence of DVT seen on physical exam. Negative Homan's sign. No cords or calf tenderness. No significant calf/ankle edema.  Discharge Diagnoses: Term Pregnancy-delivered  Discharge Information: Date: 03/20/2015 Activity: pelvic rest Diet: routine Medications: PNV and Ibuprofen Condition: stable Instructions: refer to practice specific booklet Discharge to: home Follow-up Information    Follow up with [facility] A[facility] MD. Schedule an appointment as soon as possible for a visit in 6 weeks.   Specialty:  Obstetrics and Gynecology   Why:  postpartum visit   Contact information:   [place] [address] [phone]       Newborn Data: Live born female on 03/18/2015 Birth Weight: 6 lb 11.8 oz (3056 g) APGAR: 7, 8  Home with mother.  Laury Deep, M MSN, CNM 03/20/2015, 9:53 AM

## 2015-03-20 NOTE — Progress Notes (Signed)
Patient ID: CATRICE ZULETA, female   DOB: 1980/11/23, 34 y.o.   MRN: [id number] PPD # 2 SVD  S:  Reports feeling well, up to take a shower             Tolerating po/ No nausea or vomiting             Bleeding is light             Pain controlled with ibuprofen (OTC)             Up ad lib / ambulatory / voiding without difficulties    Newborn  Information for the patient's newborn:  Sakiyah, Shur Girl Ahlayah [[id number]]  female   breast feeding    O:  A & O x 3, in no apparent distress              VS:  Filed Vitals:   03/19/15 0215 03/19/15 0614 03/19/15 1907 03/20/15 0646  BP: 127/75 120/59 117/61 124/76  Pulse: 67 68 66 52  Temp: 98.7 F (37.1 C) 98.3 F (36.8 C) 98.6 F (37 C) 98.2 F (36.8 C)  TempSrc: Oral Oral Oral Oral  Resp: 18 20 18 16   Height:      Weight:      SpO2:        LABS:   Recent Labs  03/18/15 1946 03/19/15 0640  WBC 18.1* 11.1*  HGB 11.8* 10.6*  HCT 33.7* 30.3*  PLT 182 135*    Blood type: A NEG (08/10 1040) / Rhophylac indicated, infant Rh POS  Rubella:   Immune     Lungs: Clear and unlabored  Heart: regular rate and rhythm / no murmurs  Abdomen: soft, non-tender, non-distended              Fundus: firm, non-tender, U-2  Perineum: 2nd degree repair healing well  Lochia: minimal  Extremities: No edema, no calf pain or tenderness, No Homans    A/P: PPD # 1  34 y.o., [id number]   Principal Problem:    Postpartum care following vaginal delivery (8/10)  Active Problems:    Gestational hypertension - BP WNL and stable    Postpartum state   Doing well - stable status  Routine post partum orders  D/C Home today  BP recheck at [facility] in 1 week    Graceann Congress, MSN, CNM 03/20/2015, 9:17 AM

## 2015-03-20 NOTE — Discharge Instructions (Signed)
Nutrition for the New Mother  °A new mother needs good health and nutrition so she can have energy to take care of a new baby. Whether a mother breastfeeds or formula feeds the baby, it is important to have a well-balanced diet. Foods from all the food groups should be chosen to meet the new mother's energy needs and to give her the nutrients needed for repair and healing.  °A HEALTHY EATING PLAN °The My Pyramid plan for Moms outlines what you should eat to help you and your baby stay healthy. The energy and amount of food you need depends on whether or not you are breastfeeding. If you are breastfeeding you will need more nutrients. If you choose not to breastfeed, your nutrition goal should be to return to a healthy weight. Limiting calories may be needed if you are not breastfeeding.  °HOME CARE INSTRUCTIONS  °· For a personal plan based on your unique needs, see your Registered Dietitian or visit www.mypyramid.gov. °· Eat a variety of foods. The plan below will help guide you. The following chart has a suggested daily meal plan from the My Pyramid for Moms. °· Eat a variety of fruits and vegetables. °· Eat more dark green and orange vegetables and cooked dried beans. °· Make half your grains whole grains. Choose whole instead of refined grains. °· Choose low-fat or lean meats and poultry. °· Choose low-fat or fat-free dairy products like milk, cheese, or yogurt. °Fruits °· Breastfeeding: 2 cups °· Non-Breastfeeding: 2 cups °· What Counts as a serving? °¨ 1 cup of fruit or juice. °¨ ½ cup dried fruit. °Vegetables °· Breastfeeding: 3 cups °· Non-Breastfeeding: 2 ½ cups °· What Counts as a serving? °¨ 1 cup raw or cooked vegetables. °¨ Juice or 2 cups raw leafy vegetables. °Grains °· Breastfeeding: 8 oz °· Non-Breastfeeding: 6 oz °· What Counts as a serving? °¨ 1 slice bread. °¨ 1 oz ready-to-eat cereal. °¨ ½ cup cooked pasta, rice, or cereal. °Meat and Beans °· Breastfeeding: 6 ½ oz °· Non-Breastfeeding: 5 ½  oz °· What Counts as a serving? °¨ 1 oz lean meat, poultry, or fish °¨ ¼ cup cooked dry beans °¨ ½ oz nuts or 1 egg °¨ 1 tbs peanut butter °Milk °· Breastfeeding: 3 cups °· Non-Breastfeeding: 3 cups °· What Counts as a serving? °¨ 1 cup milk. °¨ 8 oz yogurt. °¨ 1 ½ oz cheese. °¨ 2 oz processed cheese. °TIPS FOR THE BREASTFEEDING MOM °· Rapid weight loss is not suggested when you are breastfeeding. By simply breastfeeding, you will be able to lose the weight gained during your pregnancy. Your caregiver can keep track of your weight and tell you if your weight loss is appropriate. °· Be sure to drink fluids. You may notice that you are thirstier than usual. A suggestion is to drink a glass of water or other beverage whenever you breastfeed. °· Avoid alcohol as it can be passed into your breast milk. °· Limit caffeine drinks to no more than 2 to 3 cups per day. °· You may need to keep taking your prenatal vitamin while you are breastfeeding. Talk with your caregiver about taking a vitamin or supplement. °RETURING TO A HEALTHY WEIGHT °· The My Pyramid Plan for Moms will help you return to a healthy weight. It will also provide the nutrients you need. °· You may need to limit "empty" calories. These include: °¨ High fat foods like fried foods, fatty meats, fast food, butter, and mayonnaise. °¨ High   sugar foods like sodas, jelly, candy, and sweets.  Be physically active. Include 30 minutes of exercise or more each day. Choose an activity you like such as walking, swimming, biking, or aerobics. Check with your caregiver before you start to exercise. Document Released: 11/01/2007 Document Revised: 10/17/2011 Document Reviewed: 11/01/2007 [facility] Patient Information 2015 [place], [place]. This information is not intended to replace advice given to you by your health care provider. Make sure you discuss any questions you have with your health care provider. Postpartum Depression and Baby Blues The postpartum period  begins right after the birth of a baby. During this time, there is often a great amount of joy and excitement. It is also a time of many changes in the life of the parents. Regardless of how many times a mother gives birth, each child brings new challenges and dynamics to the family. It is not unusual to have feelings of excitement along with confusing shifts in moods, emotions, and thoughts. All mothers are at risk of developing postpartum depression or the "baby blues." These mood changes can occur right after giving birth, or they may occur many months after giving birth. The baby blues or postpartum depression can be mild or severe. Additionally, postpartum depression can go away rather quickly, or it can be a long-term condition.  CAUSES Raised hormone levels and the rapid drop in those levels are thought to be a main cause of postpartum depression and the baby blues. A number of hormones change during and after pregnancy. Estrogen and progesterone usually decrease right after the delivery of your baby. The levels of thyroid hormone and various cortisol steroids also rapidly drop. Other factors that play a role in these mood changes include major life events and genetics.  RISK FACTORS If you have any of the following risks for the baby blues or postpartum depression, know what symptoms to watch out for during the postpartum period. Risk factors that may increase the likelihood of getting the baby blues or postpartum depression include:  Having a personal or family history of depression.   Having depression while being pregnant.   Having premenstrual mood issues or mood issues related to oral contraceptives.  Having a lot of life stress.   Having marital conflict.   Lacking a social support network.   Having a baby with special needs.   Having health problems, such as diabetes.  SIGNS AND SYMPTOMS Symptoms of baby blues include:  Brief changes in mood, such as going from extreme  happiness to sadness.  Decreased concentration.   Difficulty sleeping.   Crying spells, tearfulness.   Irritability.   Anxiety.  Symptoms of postpartum depression typically begin within the first month after giving birth. These symptoms include:  Difficulty sleeping or excessive sleepiness.   Marked weight loss.   Agitation.   Feelings of worthlessness.   Lack of interest in activity or food.  Postpartum psychosis is a very serious condition and can be dangerous. Fortunately, it is rare. Displaying any of the following symptoms is cause for immediate medical attention. Symptoms of postpartum psychosis include:   Hallucinations and delusions.   Bizarre or disorganized behavior.   Confusion or disorientation.  DIAGNOSIS  A diagnosis is made by an evaluation of your symptoms. There are no medical or lab tests that lead to a diagnosis, but there are various questionnaires that a health care provider may use to identify those with the baby blues, postpartum depression, or psychosis. Often, a screening tool called the [place] Postnatal Depression  Scale is used to diagnose depression in the postpartum period.  TREATMENT The baby blues usually goes away on its own in 1-2 weeks. Social support is often all that is needed. You will be encouraged to get adequate sleep and rest. Occasionally, you may be given medicines to help you sleep.  Postpartum depression requires treatment because it can last several months or longer if it is not treated. Treatment may include individual or group therapy, medicine, or both to address any social, physiological, and psychological factors that may play a role in the depression. Regular exercise, a healthy diet, rest, and social support may also be strongly recommended.  Postpartum psychosis is more serious and needs treatment right away. Hospitalization is often needed. HOME CARE INSTRUCTIONS  Get as much rest as you can. Nap when the baby  sleeps.   Exercise regularly. Some women find yoga and walking to be beneficial.   Eat a balanced and nourishing diet.   Do little things that you enjoy. Have a cup of tea, take a bubble bath, read your favorite magazine, or listen to your favorite music.  Avoid alcohol.   Ask for help with household chores, cooking, grocery shopping, or running errands as needed. Do not try to do everything.   Talk to people close to you about how you are feeling. Get support from your partner, family members, friends, or other new moms.  Try to stay positive in how you think. Think about the things you are grateful for.   Do not spend a lot of time alone.   Only take over-the-counter or prescription medicine as directed by your health care provider.  Keep all your postpartum appointments.   Let your health care provider know if you have any concerns.  SEEK MEDICAL CARE IF: You are having a reaction to or problems with your medicine. SEEK IMMEDIATE MEDICAL CARE IF:  You have suicidal feelings.   You think you may harm the baby or someone else. MAKE SURE YOU:  Understand these instructions.  Will watch your condition.  Will get help right away if you are not doing well or get worse. Document Released: 04/28/2004 Document Revised: 07/30/2013 Document Reviewed: 05/06/2013 [facility] Patient Information 2015 [place], [place]. This information is not intended to replace advice given to you by your health care provider. Make sure you discuss any questions you have with your health care provider. Breast Pumping Tips If you are breastfeeding, there may be times when you cannot feed your baby directly. Returning to work or going on a trip are common examples. Pumping allows you to store breast milk and feed it to your baby later.  You may not get much milk when you first start to pump. Your breasts should start to make more after a few days. If you pump at the times you usually feed your baby,  you may be able to keep making enough milk to feed your baby without also using formula. The more often you pump, the more milk you will produce. WHEN SHOULD I PUMP?   You can begin to pump soon after delivery. However, some experts recommend waiting about 4 weeks before giving your infant a bottle to make sure breastfeeding is going well.  If you plan to return to work, begin pumping a few weeks before. This will help you develop techniques that work best for you. It also lets you build up a supply of breast milk.   When you are with your infant, feed on demand and  pump after each feeding.   When you are away from your infant for several hours, pump for about 15 minutes every 2-3 hours. Pump both breasts at the same time if you can.   If your infant has a formula feeding, make sure to pump around the same time.   If you drink any alcohol, wait 2 hours before pumping.  HOW DO I PREPARE TO PUMP? Your let-down reflexis the natural reaction to stimulation that makes your breast milk flow. It is easier to stimulate this reflex when you are relaxed. Find relaxation techniques that work for you. If you have difficulty with your let-down reflex, try these methods:   Smell one of your infant's blankets or an item of clothing.   Look at a picture or video of your infant.   Sit in a quiet, private space.   Massage the breast you plan to pump.   Place soothing warmth on the breast.   Play relaxing music.  WHAT ARE SOME GENERAL BREAST PUMPING TIPS?  Wash your hands before you pump. You do not need to wash your nipples or breasts.  There are three ways to pump.  You can use your hand to massage and compress your breast.  You can use a handheld manual pump.  You can use an electric pump.   Make sure the suction cup (flange) on the breast pump is the right size. Place the flange directly over the nipple. If it is the wrong size or placed the wrong way, it may be painful and  cause nipple damage.   If pumping is uncomfortable, apply a small amount of purified or modified lanolin to your nipple and areola.  If you are using an electric pump, adjust the speed and suction power to be more comfortable.  If pumping is painful or if you are not getting very much milk, you may need a different type of pump. A lactation consultant can help you determine what type of pump to use.   Keep a full water bottle near you at all times. Drinking lots of fluid helps you make more milk.  You can store your milk to use later. Pumped breast milk can be stored in a sealable, sterile container or plastic bag. Label all stored breast milk with the date you pumped it.  Milk can stay out at room temperature for up to 8 hours.  You can store your milk in the refrigerator for up to 8 days.  You can store your milk in the freezer for 3 months. Thaw frozen milk using warm water. Do not put it in the microwave.  Do not smoke. Smoking can lower your milk supply and harm your infant. If you need help quitting, ask your health care provider to recommend a program.  [place] A LACTATION CONSULTANT?  You are having trouble pumping.  You are concerned that you are not making enough milk.  You have nipple pain, soreness, or redness.  You want to use birth control. Birth control pills may lower your milk supply. Talk to your health care provider about your options. Document Released: 01/12/2010 Document Revised: 07/30/2013 Document Reviewed: 05/17/2013 [facility] Patient Information 2015 [place], [place]. This information is not intended to replace advice given to you by your health care provider. Make sure you discuss any questions you have with your health care provider. Breastfeeding and Mastitis Mastitis is inflammation of the breast tissue. It can occur in women who are  breastfeeding. This can make breastfeeding painful. Mastitis will sometimes go away  on its own. Your health care provider will help determine if treatment is needed. CAUSES Mastitis is often associated with a blocked milk (lactiferous) duct. This can happen when too much milk builds up in the breast. Causes of excess milk in the breast can include:  Poor latch-on. If your baby is not latched onto the breast properly, she or he may not empty your breast completely while breastfeeding.  Allowing too much time to pass between feedings.  Wearing a bra or other clothing that is too tight. This puts extra pressure on the lactiferous ducts so milk does not flow through them as it should. Mastitis can also be caused by a bacterial infection. Bacteria may enter the breast tissue through cuts or openings in the skin. In women who are breastfeeding, this may occur because of cracked or irritated skin. Cracks in the skin are often caused when your baby does not latch on properly to the breast. SIGNS AND SYMPTOMS  Swelling, redness, tenderness, and pain in an area of the breast.  Swelling of the glands under the arm on the same side.  Fever may or may not accompany mastitis. If an infection is allowed to progress, a collection of pus (abscess) may develop. DIAGNOSIS  Your health care provider can usually diagnose mastitis based on your symptoms and a physical exam. Tests may be done to help confirm the diagnosis. These may include:  Removal of pus from the breast by applying pressure to the area. This pus can be examined in the lab to determine which bacteria are present. If an abscess has developed, the fluid in the abscess can be removed with a needle. This can also be used to confirm the diagnosis and determine the bacteria present. In most cases, pus will not be present.  Blood tests to determine if your body is fighting a bacterial infection.  Mammogram or ultrasound tests to rule out other problems or diseases. TREATMENT  Mastitis that occurs with breastfeeding will sometimes go  away on its own. Your health care provider may choose to wait 24 hours after first seeing you to decide whether a prescription medicine is needed. If your symptoms are worse after 24 hours, your health care provider will likely prescribe an antibiotic medicine to treat the mastitis. He or she will determine which bacteria are most likely causing the infection and will then select an appropriate antibiotic medicine. This is sometimes changed based on the results of tests performed to identify the bacteria, or if there is no response to the antibiotic medicine selected. Antibiotic medicines are usually given by mouth. You may also be given medicine for pain. HOME CARE INSTRUCTIONS  Only take over-the-counter or prescription medicines for pain, fever, or discomfort as directed by your health care provider.  If your health care provider prescribed an antibiotic medicine, take the medicine as directed. Make sure you finish it even if you start to feel better.  Do not wear a tight or underwire bra. Wear a soft, supportive bra.  Increase your fluid intake, especially if you have a fever.  Continue to empty the breast. Your health care provider can tell you whether this milk is safe for your infant or needs to be thrown out. You may be told to stop nursing until your health care provider thinks it is safe for your baby. Use a breast pump if you are advised to stop nursing.  Keep your nipples clean  and dry.  Empty the first breast completely before going to the other breast. If your baby is not emptying your breasts completely for some reason, use a breast pump to empty your breasts.  If you go back to work, pump your breasts while at work to stay in time with your nursing schedule.  Avoid allowing your breasts to become overly filled with milk (engorged). SEEK MEDICAL CARE IF:  You have pus-like discharge from the breast.  Your symptoms do not improve with the treatment prescribed by your health care  provider within 2 days. SEEK IMMEDIATE MEDICAL CARE IF:  Your pain and swelling are getting worse.  You have pain that is not controlled with medicine.  You have a red line extending from the breast toward your armpit.  You have a fever or persistent symptoms for more than 2-3 days.  You have a fever and your symptoms suddenly get worse. MAKE SURE YOU:   Understand these instructions.  Will watch your condition.  Will get help right away if you are not doing well or get worse. Document Released: 11/19/2004 Document Revised: 07/30/2013 Document Reviewed: 02/28/2013 [facility] Patient Information 2015 [place], [place]. This information is not intended to replace advice given to you by your health care provider. Make sure you discuss any questions you have with your health care provider. Breastfeeding Deciding to breastfeed is one of the best choices you can make for you and your baby. A change in hormones during pregnancy causes your breast tissue to grow and increases the number and size of your milk ducts. These hormones also allow proteins, sugars, and fats from your blood supply to make breast milk in your milk-producing glands. Hormones prevent breast milk from being released before your baby is born as well as prompt milk flow after birth. Once breastfeeding has begun, thoughts of your baby, as well as his or her sucking or crying, can stimulate the release of milk from your milk-producing glands.  BENEFITS OF BREASTFEEDING For Your Baby  Your first milk (colostrum) helps your baby's digestive system function better.   There are antibodies in your milk that help your baby fight off infections.   Your baby has a lower incidence of asthma, allergies, and sudden infant death syndrome.   The nutrients in breast milk are better for your baby than infant formulas and are designed uniquely for your baby's needs.   Breast milk improves your baby's brain development.   Your baby is  less likely to develop other conditions, such as childhood obesity, asthma, or type 2 diabetes mellitus.  For You   Breastfeeding helps to create a very special bond between you and your baby.   Breastfeeding is convenient. Breast milk is always available at the correct temperature and costs nothing.   Breastfeeding helps to burn calories and helps you lose the weight gained during pregnancy.   Breastfeeding makes your uterus contract to its prepregnancy size faster and slows bleeding (lochia) after you give birth.   Breastfeeding helps to lower your risk of developing type 2 diabetes mellitus, osteoporosis, and breast or ovarian cancer later in life. SIGNS THAT YOUR BABY IS HUNGRY Early Signs of Hunger  Increased alertness or activity.  Stretching.  Movement of the head from side to side.  Movement of the head and opening of the mouth when the corner of the mouth or cheek is stroked (rooting).  Increased sucking sounds, smacking lips, cooing, sighing, or squeaking.  Hand-to-mouth movements.  Increased sucking of fingers  or hands. Late Signs of Hunger  Fussing.  Intermittent crying. Extreme Signs of Hunger Signs of extreme hunger will require calming and consoling before your baby will be able to breastfeed successfully. Do not wait for the following signs of extreme hunger to occur before you initiate breastfeeding:   Restlessness.  A loud, strong cry.   Screaming. BREASTFEEDING BASICS Breastfeeding Initiation  Find a comfortable place to sit or lie down, with your neck and back well supported.  Place a pillow or rolled up blanket under your baby to bring him or her to the level of your breast (if you are seated). Nursing pillows are specially designed to help support your arms and your baby while you breastfeed.  Make sure that your baby's abdomen is facing your abdomen.   Gently massage your breast. With your fingertips, massage from your chest wall toward  your nipple in a circular motion. This encourages milk flow. You may need to continue this action during the feeding if your milk flows slowly.  Support your breast with 4 fingers underneath and your thumb above your nipple. Make sure your fingers are well away from your nipple and your baby's mouth.   Stroke your baby's lips gently with your finger or nipple.   When your baby's mouth is open wide enough, quickly bring your baby to your breast, placing your entire nipple and as much of the colored area around your nipple (areola) as possible into your baby's mouth.   More areola should be visible above your baby's upper lip than below the lower lip.   Your baby's tongue should be between his or her lower gum and your breast.   Ensure that your baby's mouth is correctly positioned around your nipple (latched). Your baby's lips should create a seal on your breast and be turned out (everted).  It is common for your baby to suck about 2-3 minutes in order to start the flow of breast milk. Latching Teaching your baby how to latch on to your breast properly is very important. An improper latch can cause nipple pain and decreased milk supply for you and poor weight gain in your baby. Also, if your baby is not latched onto your nipple properly, he or she may swallow some air during feeding. This can make your baby fussy. Burping your baby when you switch breasts during the feeding can help to get rid of the air. However, teaching your baby to latch on properly is still the best way to prevent fussiness from swallowing air while breastfeeding. Signs that your baby has successfully latched on to your nipple:    Silent tugging or silent sucking, without causing you pain.   Swallowing heard between every 3-4 sucks.    Muscle movement above and in front of his or her ears while sucking.  Signs that your baby has not successfully latched on to nipple:   Sucking sounds or smacking sounds from  your baby while breastfeeding.  Nipple pain. If you think your baby has not latched on correctly, slip your finger into the corner of your baby's mouth to break the suction and place it between your baby's gums. Attempt breastfeeding initiation again. Signs of Successful Breastfeeding Signs from your baby:   A gradual decrease in the number of sucks or complete cessation of sucking.   Falling asleep.   Relaxation of his or her body.   Retention of a small amount of milk in his or her mouth.   Letting go of  your breast by himself or herself. Signs from you:  Breasts that have increased in firmness, weight, and size 1-3 hours after feeding.   Breasts that are softer immediately after breastfeeding.  Increased milk volume, as well as a change in milk consistency and color by the fifth day of breastfeeding.   Nipples that are not sore, cracked, or bleeding. Signs That Your [place] is Getting Enough Milk  Wetting at least 3 diapers in a 24-hour period. The urine should be clear and pale yellow by age 80 days.  At least 3 stools in a 24-hour period by age 80 days. The stool should be soft and yellow.  At least 3 stools in a 24-hour period by age 99 days. The stool should be seedy and yellow.  No loss of weight greater than 10% of birth weight during the first 8 days of age.  Average weight gain of 4-7 ounces (113-198 g) per week after age 42 days.  Consistent daily weight gain by age 804 days, without weight loss after the age of 2 weeks. After a feeding, your baby may spit up a small amount. This is common. BREASTFEEDING FREQUENCY AND DURATION Frequent feeding will help you make more milk and can prevent sore nipples and breast engorgement. Breastfeed when you feel the need to reduce the fullness of your breasts or when your baby shows signs of hunger. This is called "breastfeeding on demand." Avoid introducing a pacifier to your baby while you are working to establish breastfeeding  (the first 4-6 weeks after your baby is born). After this time you may choose to use a pacifier. Research has shown that pacifier use during the first year of a baby's life decreases the risk of sudden infant death syndrome (SIDS). Allow your baby to feed on each breast as long as he or she wants. Breastfeed until your baby is finished feeding. When your baby unlatches or falls asleep while feeding from the first breast, offer the second breast. Because newborns are often sleepy in the first few weeks of life, you may need to awaken your baby to get him or her to feed. Breastfeeding times will vary from baby to baby. However, the following rules can serve as a guide to help you ensure that your baby is properly fed:  Newborns (babies 80 weeks of age or younger) may breastfeed every 1-3 hours.  Newborns should not go longer than 3 hours during the day or 5 hours during the night without breastfeeding.  You should breastfeed your baby a minimum of 8 times in a 24-hour period until you begin to introduce solid foods to your baby at around 36 months of age. BREAST MILK PUMPING Pumping and storing breast milk allows you to ensure that your baby is exclusively fed your breast milk, even at times when you are unable to breastfeed. This is especially important if you are going back to work while you are still breastfeeding or when you are not able to be present during feedings. Your lactation consultant can give you guidelines on how long it is safe to store breast milk.  A breast pump is a machine that allows you to pump milk from your breast into a sterile bottle. The pumped breast milk can then be stored in a refrigerator or freezer. Some breast pumps are operated by hand, while others use electricity. Ask your lactation consultant which type will work best for you. Breast pumps can be purchased, but some hospitals and breastfeeding support groups lease  breast pumps on a monthly basis. A lactation consultant can  teach you how to hand express breast milk, if you prefer not to use a pump.  CARING FOR YOUR BREASTS WHILE YOU BREASTFEED Nipples can become dry, cracked, and sore while breastfeeding. The following recommendations can help keep your breasts moisturized and healthy:  Avoid using soap on your nipples.   Wear a supportive bra. Although not required, special nursing bras and tank tops are designed to allow access to your breasts for breastfeeding without taking off your entire bra or top. Avoid wearing underwire-style bras or extremely tight bras.  Air dry your nipples for 3-15minutes after each feeding.   Use only cotton bra pads to absorb leaked breast milk. Leaking of breast milk between feedings is normal.   Use lanolin on your nipples after breastfeeding. Lanolin helps to maintain your skin's normal moisture barrier. If you use pure lanolin, you do not need to wash it off before feeding your baby again. Pure lanolin is not toxic to your baby. You may also hand express a few drops of breast milk and gently massage that milk into your nipples and allow the milk to air dry. In the first few weeks after giving birth, some women experience extremely full breasts (engorgement). Engorgement can make your breasts feel heavy, warm, and tender to the touch. Engorgement peaks within 3-5 days after you give birth. The following recommendations can help ease engorgement:  Completely empty your breasts while breastfeeding or pumping. You may want to start by applying warm, moist heat (in the shower or with warm water-soaked hand towels) just before feeding or pumping. This increases circulation and helps the milk flow. If your baby does not completely empty your breasts while breastfeeding, pump any extra milk after he or she is finished.  Wear a snug bra (nursing or regular) or tank top for 1-2 days to signal your body to slightly decrease milk production.  Apply ice packs to your breasts, unless this is  too uncomfortable for you.  Make sure that your baby is latched on and positioned properly while breastfeeding. If engorgement persists after 48 hours of following these recommendations, contact your health care provider or a Science writer. OVERALL HEALTH CARE RECOMMENDATIONS WHILE BREASTFEEDING  Eat healthy foods. Alternate between meals and snacks, eating 3 of each per day. Because what you eat affects your breast milk, some of the foods may make your baby more irritable than usual. Avoid eating these foods if you are sure that they are negatively affecting your baby.  Drink milk, fruit juice, and water to satisfy your thirst (about 10 glasses a day).   Rest often, relax, and continue to take your prenatal vitamins to prevent fatigue, stress, and anemia.  Continue breast self-awareness checks.  Avoid chewing and smoking tobacco.  Avoid alcohol and drug use. Some medicines that may be harmful to your baby can pass through breast milk. It is important to ask your health care provider before taking any medicine, including all over-the-counter and prescription medicine as well as vitamin and herbal supplements. It is possible to become pregnant while breastfeeding. If birth control is desired, ask your health care provider about options that will be safe for your baby. SEEK MEDICAL CARE IF:   You feel like you want to stop breastfeeding or have become frustrated with breastfeeding.  You have painful breasts or nipples.  Your nipples are cracked or bleeding.  Your breasts are red, tender, or warm.  You have a  swollen area on either breast.  You have a fever or chills.  You have nausea or vomiting.  You have drainage other than breast milk from your nipples.  Your breasts do not become full before feedings by the fifth day after you give birth.  You feel sad and depressed.  Your baby is too sleepy to eat well.  Your baby is having trouble sleeping.   Your baby is  wetting less than 3 diapers in a 24-hour period.  Your baby has less than 3 stools in a 24-hour period.  Your baby's skin or the white part of his or her eyes becomes yellow.   Your baby is not gaining weight by 65 days of age. SEEK IMMEDIATE MEDICAL CARE IF:   Your baby is overly tired (lethargic) and does not want to wake up and feed.  Your baby develops an unexplained fever. Document Released: 07/25/2005 Document Revised: 07/30/2013 Document Reviewed: 01/16/2013 [facility] Patient Information 2015 [place], [place]. This information is not intended to replace advice given to you by your health care provider. Make sure you discuss any questions you have with your health care provider. Postpartum Care After Vaginal Delivery After you deliver your newborn (postpartum period), the usual stay in the hospital is 24-72 hours. If there were problems with your labor or delivery, or if you have other medical problems, you might be in the hospital longer.  While you are in the hospital, you will receive help and instructions on how to care for yourself and your newborn during the postpartum period.  While you are in the hospital:  Be sure to tell your nurses if you have pain or discomfort, as well as where you feel the pain and what makes the pain worse.  If you had an incision made near your vagina (episiotomy) or if you had some tearing during delivery, the nurses may put ice packs on your episiotomy or tear. The ice packs may help to reduce the pain and swelling.  If you are breastfeeding, you may feel uncomfortable contractions of your uterus for a couple of weeks. This is normal. The contractions help your uterus get back to normal size.  It is normal to have some bleeding after delivery.  For the first 1-3 days after delivery, the flow is red and the amount may be similar to a period.  It is common for the flow to start and stop.  In the first few days, you may pass some small clots. Let  your nurses know if you begin to pass large clots or your flow increases.  Do not  flush blood clots down the toilet before having the nurse look at them.  During the next 3-10 days after delivery, your flow should become more watery and pink or brown-tinged in color.  Ten to fourteen days after delivery, your flow should be a small amount of yellowish-white discharge.  The amount of your flow will decrease over the first few weeks after delivery. Your flow may stop in 6-8 weeks. Most women have had their flow stop by 12 weeks after delivery.  You should change your sanitary pads frequently.  Wash your hands thoroughly with soap and water for at least 20 seconds after changing pads, using the toilet, or before holding or feeding your newborn.  You should feel like you need to empty your bladder within the first 6-8 hours after delivery.  In case you become weak, lightheaded, or faint, call your nurse before you get out of  bed for the first time and before you take a shower for the first time.  Within the first few days after delivery, your breasts may begin to feel tender and full. This is called engorgement. Breast tenderness usually goes away within 48-72 hours after engorgement occurs. You may also notice milk leaking from your breasts. If you are not breastfeeding, do not stimulate your breasts. Breast stimulation can make your breasts produce more milk.  Spending as much time as possible with your newborn is very important. During this time, you and your newborn can feel close and get to know each other. Having your newborn stay in your room (rooming in) will help to strengthen the bond with your newborn. It will give you time to get to know your newborn and become comfortable caring for your newborn.  Your hormones change after delivery. Sometimes the hormone changes can temporarily cause you to feel sad or tearful. These feelings should not last more than a few days. If these feelings  last longer than that, you should talk to your caregiver.  If desired, talk to your caregiver about methods of family planning or contraception.  Talk to your caregiver about immunizations. Your caregiver may want you to have the following immunizations before leaving the hospital:  Tetanus, diphtheria, and pertussis (Tdap) or tetanus and diphtheria (Td) immunization. It is very important that you and your family (including grandparents) or others caring for your newborn are up-to-date with the Tdap or Td immunizations. The Tdap or Td immunization can help protect your newborn from getting ill.  Rubella immunization.  Varicella (chickenpox) immunization.  Influenza immunization. You should receive this annual immunization if you did not receive the immunization during your pregnancy. Document Released: 05/22/2007 Document Revised: 04/18/2012 Document Reviewed: 03/21/2012 [facility] Patient Information 2015 [place], [place]. This information is not intended to replace advice given to you by your health care provider. Make sure you discuss any questions you have with your health care provider.

## 2015-03-22 ENCOUNTER — Encounter (HOSPITAL_COMMUNITY): Payer: Self-pay | Admitting: *Deleted

## 2015-03-22 ENCOUNTER — Inpatient Hospital Stay (HOSPITAL_COMMUNITY)
Admission: AD | Admit: 2015-03-22 | Discharge: 2015-03-22 | Disposition: A | Discharge status: Home / Self Care | Payer: 59 | Source: Ambulatory Visit | Attending: Obstetrics & Gynecology | Admitting: Obstetrics & Gynecology

## 2015-03-22 DIAGNOSIS — R030 Elevated blood-pressure reading, without diagnosis of hypertension: Secondary | ICD-10-CM | POA: Diagnosis present

## 2015-03-22 DIAGNOSIS — I158 Other secondary hypertension: Secondary | ICD-10-CM | POA: Diagnosis not present

## 2015-03-22 DIAGNOSIS — O9089 Other complications of the puerperium, not elsewhere classified: Secondary | ICD-10-CM | POA: Diagnosis not present

## 2015-03-22 LAB — URIC ACID: Uric Acid, Serum: 4.7 mg/dL (ref 2.3–6.6)

## 2015-03-22 LAB — URINALYSIS, ROUTINE W REFLEX MICROSCOPIC
Bilirubin Urine: NEGATIVE
GLUCOSE, UA: NEGATIVE mg/dL
Ketones, ur: NEGATIVE mg/dL
NITRITE: NEGATIVE
Protein, ur: NEGATIVE mg/dL
Specific Gravity, Urine: <1.005 — ABNORMAL LOW (ref 1.005–1.030)
Urobilinogen, UA: 0.2 mg/dL (ref 0.0–1.0)
pH: 6 (ref 5.0–8.0)

## 2015-03-22 LAB — TYPE AND SCREEN
ABO/RH(D): A NEG
ANTIBODY SCREEN: POSITIVE
DAT, IgG: NEGATIVE
Unit division: 0
Unit division: 0

## 2015-03-22 LAB — CBC
HCT: 30.9 % — ABNORMAL LOW (ref 36.0–46.0)
Hemoglobin: 10.5 g/dL — ABNORMAL LOW (ref 12.0–15.0)
MCH: 29.9 pg (ref 26.0–34.0)
MCHC: 34 g/dL (ref 30.0–36.0)
MCV: 88 fL (ref 78.0–100.0)
Platelets: 197 10*3/uL (ref 150–400)
RBC: 3.51 MIL/uL — ABNORMAL LOW (ref 3.87–5.11)
RDW: 13 % (ref 11.5–15.5)
WBC: 8 10*3/uL (ref 4.0–10.5)

## 2015-03-22 LAB — COMPREHENSIVE METABOLIC PANEL
ALT: 35 U/L (ref 14–54)
AST: 32 U/L (ref 15–41)
Albumin: 3 g/dL — ABNORMAL LOW (ref 3.5–5.0)
Alkaline Phosphatase: 119 U/L (ref 38–126)
Anion gap: 7 (ref 5–15)
BUN: 14 mg/dL (ref 6–20)
CO2: 25 mmol/L (ref 22–32)
Calcium: 8.8 mg/dL — ABNORMAL LOW (ref 8.9–10.3)
Chloride: 104 mmol/L (ref 101–111)
Creatinine, Ser: 0.71 mg/dL (ref 0.44–1.00)
GFR calc Af Amer: >60 mL/min (ref >60)
GFR calc non Af Amer: >60 mL/min (ref >60)
Glucose, Bld: 85 mg/dL (ref 65–99)
Potassium: 3.8 mmol/L (ref 3.5–5.1)
Sodium: 136 mmol/L (ref 135–145)
Total Bilirubin: 0.2 mg/dL — ABNORMAL LOW (ref 0.3–1.2)
Total Protein: 6.6 g/dL (ref 6.5–8.1)

## 2015-03-22 LAB — URINE MICROSCOPIC-ADD ON

## 2015-03-22 NOTE — MAU Provider Note (Signed)
History     CSN: [id number]  Arrival date and time: 03/22/15 1806 Nurse call to provider - orders entered in EPIC @ 1835    Chief Complaint  Patient presents with  . Hypertension   HPI  Reports elevated BP today when checked this PM Mild headache today  No swelling  No vision changes  Past Medical History  Diagnosis Date  . Anemia   . Pregnancy induced hypertension   . Female infertility of unspecified origin   . Heart murmur   . H/O varicella   . Headache(784.0)     migraine  . Postpartum care following vaginal delivery (8/10) 03/19/2015    Past Surgical History  Procedure Laterality Date  . Soft tissue cyst excision  2009    cyst removed from neck  . Wisdom tooth extraction    . Oral mucocele excision  2007    of lip  . Dilation and evacuation  03/19/2012    Procedure: DILATATION AND EVACUATION;  Surgeon: Kelly A. Fogleman, MD;  Location: [facility];  Service: Gynecology;  Laterality: N/A;  With Ultrasound  . Salivary gland removal  2009    Family History  Problem Relation Age of Onset  . Other Neg Hx   . Migraines Mother   . Hyperlipidemia Mother   . Arthritis Mother   . Heart murmur Mother   . Cancer Maternal Aunt 34    breast  . Heart attack Maternal Grandmother   . Heart disease Maternal Grandfather   . Hyperlipidemia Paternal Grandmother   . Heart attack Paternal Grandfather   . Glaucoma Father     Social History  Substance Use Topics  . Smoking status: Never Smoker   . Smokeless tobacco: Never Used  . Alcohol Use: No     Comment: occasional EtOH before pregnancy    Allergies: No Known Allergies  Prescriptions prior to admission  Medication Sig Dispense Refill Last Dose  . calcium carbonate (TUMS EX) 750 MG chewable tablet Chew 2 tablets by mouth as needed for heartburn.   03/17/2015 at Unknown time  . ibuprofen (ADVIL,MOTRIN) 600 MG tablet Take 1 tablet (600 mg total) by mouth every 6 (six) hours. 30 tablet 0   . Prenatal Vit-Fe Fumarate-FA  (PRENATAL MULTIVITAMIN) TABS tablet Take 1 tablet by mouth daily at 12 noon.   03/17/2015 at Unknown time  . ranitidine (ZANTAC) 150 MG tablet Take 150 mg by mouth 2 (two) times daily.   03/18/2015 at Unknown time    ROS Physical Exam   Blood pressure 135/89, pulse 68, temperature 98 F (36.7 C), temperature source Oral, resp. rate 16, unknown if currently breastfeeding.  BP: 151/90 - 135/89 - 131/79 - 126/73 - 131/80 - 129/78  Physical Exam  Alert and oriented Abdomen soft and non-tender Extremities - no edema / normal DTR at 1+   Results for Taubman, Marvelous C (MRN [id number]) as of 03/22/2015 19:56  Ref. Range 03/22/2015 18:50  Sodium Latest Ref Range: 135-145 mmol/L 136  Potassium Latest Ref Range: 3.5-5.1 mmol/L 3.8  Chloride Latest Ref Range: 101-111 mmol/L 104  CO2 Latest Ref Range: 22-32 mmol/L 25  BUN Latest Ref Range: 6-20 mg/dL 14  Creatinine Latest Ref Range: 0.44-1.00 mg/dL 0.71  Calcium Latest Ref Range: 8.9-10.3 mg/dL 8.8 (L)  EGFR (Non-African Amer.) Latest Ref Range: >60 mL/min >60  EGFR (African American) Latest Ref Range: >60 mL/min >60  Glucose Latest Ref Range: 65-99 mg/dL 85  Anion gap Latest Ref Range: 5-15  7  Alkaline   Phosphatase Latest Ref Range: 38-126 U/L 119  Albumin Latest Ref Range: 3.5-5.0 g/dL 3.0 (L)  Uric Acid, Serum Latest Ref Range: 2.3-6.6 mg/dL 4.7  AST Latest Ref Range: 15-41 U/L 32  ALT Latest Ref Range: 14-54 U/L 35  Total Protein Latest Ref Range: 6.5-8.1 g/dL 6.6  Total Bilirubin Latest Ref Range: 0.3-1.2 mg/dL 0.2 (L)  WBC Latest Ref Range: 4.0-10.5 K/uL 8.0  RBC Latest Ref Range: 3.87-5.11 MIL/uL 3.51 (L)  Hemoglobin Latest Ref Range: 12.0-15.0 g/dL 10.5 (L)  HCT Latest Ref Range: 36.0-46.0 % 30.9 (L)  MCV Latest Ref Range: 78.0-100.0 fL 88.0  MCH Latest Ref Range: 26.0-34.0 pg 29.9  MCHC Latest Ref Range: 30.0-36.0 g/dL 34.0  RDW Latest Ref Range: 11.5-15.5 % 13.0  Platelets Latest Ref Range: 150-400 K/uL 197   MAU Course   Procedures   Assessment and Plan  Postpartum Gestational hypertension - delivered No evidence of PEC  1) DC home 2) rest at home more - TID rest x 1 hour with feet up 3) keep BP recheck at office Thursday as scheduled  Artelia Laroche 03/22/2015, 6:35 PM

## 2015-03-22 NOTE — MAU Note (Signed)
Pt had some borderline high pressures during pregnancy, no medications but was induced at 39 weeks.  Checked it periodically at home and it was still up a little bit, she developed a headache and took her pressure and it was in the 160s/90s.

## 2015-08-12 DIAGNOSIS — M79641 Pain in right hand: Secondary | ICD-10-CM | POA: Diagnosis not present

## 2015-08-12 DIAGNOSIS — M79642 Pain in left hand: Secondary | ICD-10-CM | POA: Diagnosis not present

## 2015-08-12 DIAGNOSIS — G5601 Carpal tunnel syndrome, right upper limb: Secondary | ICD-10-CM | POA: Diagnosis not present

## 2015-08-12 DIAGNOSIS — G5603 Carpal tunnel syndrome, bilateral upper limbs: Secondary | ICD-10-CM | POA: Diagnosis not present

## 2015-08-12 DIAGNOSIS — G5602 Carpal tunnel syndrome, left upper limb: Secondary | ICD-10-CM | POA: Diagnosis not present

## 2015-12-01 ENCOUNTER — Ambulatory Visit (INDEPENDENT_AMBULATORY_CARE_PROVIDER_SITE_OTHER): Payer: 59 | Admitting: Family Medicine

## 2015-12-01 ENCOUNTER — Encounter: Payer: Self-pay | Admitting: Family Medicine

## 2015-12-01 VITALS — BP 106/64 | HR 59 | Temp 98.0°F | Ht 65.25 in | Wt 157.0 lb

## 2015-12-01 DIAGNOSIS — M419 Scoliosis, unspecified: Secondary | ICD-10-CM

## 2015-12-01 DIAGNOSIS — D171 Benign lipomatous neoplasm of skin and subcutaneous tissue of trunk: Secondary | ICD-10-CM | POA: Diagnosis not present

## 2015-12-01 DIAGNOSIS — M545 Low back pain, unspecified: Secondary | ICD-10-CM

## 2015-12-01 DIAGNOSIS — Z319 Encounter for procreative management, unspecified: Secondary | ICD-10-CM | POA: Diagnosis not present

## 2015-12-01 DIAGNOSIS — L700 Acne vulgaris: Secondary | ICD-10-CM | POA: Diagnosis not present

## 2015-12-01 NOTE — Progress Notes (Signed)
Pre visit review using our clinic review tool, if applicable. No additional management support is needed unless otherwise documented below in the visit note. 

## 2015-12-01 NOTE — Progress Notes (Signed)
   Subjective:    Patient ID: [name], female    DOB: [birth year], 35 y.o.   MRN: [id number]  HPI  35 year old female with history of scoliosis, mild and  low back pain presents with an acute worsening of low back pain. Ongoing for years off and on.  Worse now in last 1 month. 5-6/10 pain scale. More constant baseline soreness 1/10.  Right low back, radiation of pain to right high.  No numbness, no weakness.  No fever, no incontinence.   She reports low back pain has been worse with carrying around her 8 months old.  Worse with standing. Better with leaning forward. No known injury, no fall.  Has treated with heating pad, stretching. She is nursing so does not want to take a lot of meds.  Social History /Family History/Past Medical History reviewed and updated if needed. Hx of lipomas in back .   Review of Systems  Constitutional: Negative for fever and fatigue.  HENT: Negative for ear pain.   Eyes: Negative for pain.  Respiratory: Negative for chest tightness and shortness of breath.   Cardiovascular: Negative for chest pain, palpitations and leg swelling.  Gastrointestinal: Negative for abdominal pain.  Genitourinary: Negative for dysuria.       Objective:   Physical Exam  Constitutional: Vital signs are normal. She appears well-developed and well-nourished. She is cooperative.  Non-toxic appearance. She does not appear ill. No distress.  HENT:  Head: Normocephalic.  Right Ear: Hearing, tympanic membrane, external ear and ear canal normal. Tympanic membrane is not erythematous, not retracted and not bulging.  Left Ear: Hearing, tympanic membrane, external ear and ear canal normal. Tympanic membrane is not erythematous, not retracted and not bulging.  Nose: No mucosal edema or rhinorrhea. Right sinus exhibits no maxillary sinus tenderness and no frontal sinus tenderness. Left sinus exhibits no maxillary sinus tenderness and no frontal sinus tenderness.  Mouth/Throat:  Uvula is midline, oropharynx is clear and moist and mucous membranes are normal.  Eyes: Conjunctivae, EOM and lids are normal. Pupils are equal, round, and reactive to light. Lids are everted and swept, no foreign bodies found.  Neck: Trachea normal and normal range of motion. Neck supple. Carotid bruit is not present. No thyroid mass and no thyromegaly present.  Cardiovascular: Normal rate, regular rhythm, S1 normal, S2 normal, normal heart sounds, intact distal pulses and normal pulses.  Exam reveals no gallop and no friction rub.   No murmur heard. Pulmonary/Chest: Effort normal and breath sounds normal. No tachypnea. No respiratory distress. She has no decreased breath sounds. She has no wheezes. She has no rhonchi. She has no rales.  Abdominal: Soft. Normal appearance and bowel sounds are normal. There is no tenderness.  Musculoskeletal:       Lumbar back: She exhibits tenderness. She exhibits normal range of motion and no bony tenderness.  Neg SLR bilaterally,  ttp in right low back and in right buttock  Neurological: She is alert.  Skin: Skin is warm, dry and intact. No rash noted.  Small mobile masses bilateral low back, not associated with spine.  Psychiatric: Her speech is normal and behavior is normal. Judgment and thought content normal. Her mood appears not anxious. Cognition and memory are normal. She does not exhibit a depressed mood.          Assessment & Plan:

## 2015-12-01 NOTE — Assessment & Plan Note (Signed)
Heat, start home PT, NSAIDS. If not improving eval with X-ray and refer for PT.

## 2015-12-01 NOTE — Patient Instructions (Addendum)
Start ibuprofen 800 mg every 8 hours. Heat and start low back stretching. Work on strengthening the core muscles over time. Avoid heavy lifting. Follow up if not improving in 2 weeks for further treatment and eval.

## 2015-12-01 NOTE — Assessment & Plan Note (Signed)
Doubt this is contributing to back pain. No sign of infection.

## 2015-12-04 MED FILL — ACZONE 7.5% GEL PUMP: 7.5 | 90 days supply | Qty: 90 | Fill #0

## 2015-12-04 MED FILL — ADAPALENE 0.1% GEL: 0.1 | 30 days supply | Qty: 45 | Fill #0

## 2016-01-28 DIAGNOSIS — G5603 Carpal tunnel syndrome, bilateral upper limbs: Secondary | ICD-10-CM | POA: Diagnosis not present

## 2016-02-15 DIAGNOSIS — G5603 Carpal tunnel syndrome, bilateral upper limbs: Secondary | ICD-10-CM | POA: Diagnosis not present

## 2016-03-08 ENCOUNTER — Other Ambulatory Visit: Payer: Self-pay | Admitting: Orthopedic Surgery

## 2016-03-28 ENCOUNTER — Encounter (HOSPITAL_BASED_OUTPATIENT_CLINIC_OR_DEPARTMENT_OTHER): Payer: Self-pay | Admitting: *Deleted

## 2016-03-28 NOTE — Progress Notes (Signed)
NPO AFTER MN.  ARRIVE AT 0845.  NEEDS HG AND URINE PREG.   

## 2016-03-31 ENCOUNTER — Ambulatory Visit (INDEPENDENT_AMBULATORY_CARE_PROVIDER_SITE_OTHER): Payer: 59 | Admitting: Family Medicine

## 2016-03-31 ENCOUNTER — Encounter: Payer: Self-pay | Admitting: Family Medicine

## 2016-03-31 VITALS — BP 120/79 | HR 66 | Temp 98.1°F | Ht 65.5 in | Wt 150.2 lb

## 2016-03-31 DIAGNOSIS — Z13220 Encounter for screening for lipoid disorders: Secondary | ICD-10-CM

## 2016-03-31 DIAGNOSIS — Z0000 Encounter for general adult medical examination without abnormal findings: Secondary | ICD-10-CM

## 2016-03-31 LAB — COMPREHENSIVE METABOLIC PANEL
ALBUMIN: 4.6 g/dL (ref 3.5–5.2)
ALK PHOS: 40 U/L (ref 39–117)
ALT: 13 U/L (ref 0–35)
AST: 15 U/L (ref 0–37)
BUN: 10 mg/dL (ref 6–23)
CHLORIDE: 101 meq/L (ref 96–112)
CO2: 30 mEq/L (ref 19–32)
CREATININE: 0.65 mg/dL (ref 0.40–1.20)
Calcium: 9.2 mg/dL (ref 8.4–10.5)
GFR: 109.38 mL/min (ref >60.00)
GLUCOSE: 82 mg/dL (ref 70–99)
Potassium: 4.1 mEq/L (ref 3.5–5.1)
SODIUM: 138 meq/L (ref 135–145)
TOTAL PROTEIN: 7.6 g/dL (ref 6.0–8.3)
Total Bilirubin: 0.6 mg/dL (ref 0.2–1.2)

## 2016-03-31 LAB — LIPID PANEL
CHOLESTEROL: 147 mg/dL (ref 0–200)
HDL: 59.1 mg/dL (ref >39.00)
LDL CALC: 80 mg/dL (ref 0–99)
NONHDL: 87.78
Total CHOL/HDL Ratio: 2
Triglycerides: 37 mg/dL (ref 0.0–149.0)
VLDL: 7.4 mg/dL (ref 0.0–40.0)

## 2016-03-31 NOTE — Progress Notes (Addendum)
   Subjective:    Patient ID: [name], female    DOB: 1981/07/30, 35 y.o.   MRN: [id number]  HPI  35 year old female presents for wellness exam.  Last saw GYN  05/2015 pap done.  Has upcoming carpal tunnel release Dr. Amedeo Plenty next week, 04/05/2016.  Diet: healthy, lots water Exercise: minimal.   She is still nursing. She is back at work now.  Has 36 year old.   Wt Readings from Last 3 Encounters:  03/31/16 150 lb 4 oz (68.2 kg)  12/01/15 157 lb (71.2 kg)  03/18/15 180 lb (81.6 kg)  Body mass index is 24.62 kg/m.     Social History /Family History/Past Medical History reviewed and updated if needed.   Review of Systems  Constitutional: Negative for fatigue and fever.  HENT: Negative for ear pain.   Eyes: Negative for pain.  Respiratory: Negative for chest tightness and shortness of breath.   Cardiovascular: Negative for chest pain, palpitations and leg swelling.  Gastrointestinal: Negative for abdominal pain.  Endocrine: Negative for cold intolerance.  Genitourinary: Negative for dysuria.  Musculoskeletal: Negative for back pain.  Neurological: Negative for dizziness.  Psychiatric/Behavioral: Negative for dysphoric mood.       Objective:   Physical Exam  Constitutional: Vital signs are normal. She appears well-developed and well-nourished. She is cooperative.  Non-toxic appearance. She does not appear ill. No distress.  HENT:  Head: Normocephalic.  Right Ear: Hearing, tympanic membrane, external ear and ear canal normal.  Left Ear: Hearing, tympanic membrane, external ear and ear canal normal.  Nose: Nose normal.  Eyes: Conjunctivae, EOM and lids are normal. Pupils are equal, round, and reactive to light. Lids are everted and swept, no foreign bodies found.  Neck: Trachea normal and normal range of motion. Neck supple. Carotid bruit is not present. No thyroid mass and no thyromegaly present.  Cardiovascular: Normal rate, regular rhythm, S1 normal, S2 normal,  normal heart sounds and intact distal pulses.  Exam reveals no gallop.   No murmur heard. Pulmonary/Chest: Effort normal and breath sounds normal. No respiratory distress. She has no wheezes. She has no rhonchi. She has no rales.  Abdominal: Soft. Normal appearance and bowel sounds are normal. She exhibits no distension, no fluid wave, no abdominal bruit and no mass. There is no hepatosplenomegaly. There is no tenderness. There is no rebound, no guarding and no CVA tenderness. No hernia.  Lymphadenopathy:    She has no cervical adenopathy.    She has no axillary adenopathy.  Neurological: She is alert. She has normal strength. No cranial nerve deficit or sensory deficit.  Skin: Skin is warm, dry and intact. No rash noted.  Psychiatric: Her speech is normal and behavior is normal. Judgment normal. Her mood appears not anxious. Cognition and memory are normal. She does not exhibit a depressed mood.          Assessment & Plan:  The patient's preventative maintenance and recommended screening tests for an annual wellness exam were reviewed in full today. Brought up to date unless services declined.  Counselled on the importance of diet, exercise, and its role in overall health and mortality. The patient's FH and SH was reviewed, including their home life, tobacco status, and drug and alcohol status.   Vaccines: Uptodate, consider flu.. Will get at hospital. Nonsmoker Colon:no early family hx breast cancer  Mammogram: no early family hx breast cancer  Breast PAP: per GYN.

## 2016-03-31 NOTE — Progress Notes (Signed)
Pre visit review using our clinic review tool, if applicable. No additional management support is needed unless otherwise documented below in the visit note. 

## 2016-03-31 NOTE — Patient Instructions (Addendum)
Stop at lab on way out.  Get flu vaccine at work.  Work on [employer] and regular exercise.

## 2016-04-01 ENCOUNTER — Encounter: Payer: Self-pay | Admitting: *Deleted

## 2016-04-04 NOTE — Progress Notes (Signed)
Okay 

## 2016-04-05 ENCOUNTER — Ambulatory Visit (HOSPITAL_BASED_OUTPATIENT_CLINIC_OR_DEPARTMENT_OTHER): Payer: 59 | Admitting: Anesthesiology

## 2016-04-05 ENCOUNTER — Ambulatory Visit (HOSPITAL_BASED_OUTPATIENT_CLINIC_OR_DEPARTMENT_OTHER)
Admission: RE | Admit: 2016-04-05 | Discharge: 2016-04-05 | Disposition: A | Discharge status: Home / Self Care | Payer: 59 | Source: Ambulatory Visit | Attending: Orthopedic Surgery | Admitting: Orthopedic Surgery

## 2016-04-05 ENCOUNTER — Encounter (HOSPITAL_BASED_OUTPATIENT_CLINIC_OR_DEPARTMENT_OTHER): Payer: Self-pay | Admitting: *Deleted

## 2016-04-05 ENCOUNTER — Encounter (HOSPITAL_BASED_OUTPATIENT_CLINIC_OR_DEPARTMENT_OTHER)
Admission: RE | Disposition: A | Discharge status: Home / Self Care | Payer: Self-pay | Source: Ambulatory Visit | Attending: Orthopedic Surgery

## 2016-04-05 DIAGNOSIS — Z79899 Other long term (current) drug therapy: Secondary | ICD-10-CM | POA: Diagnosis not present

## 2016-04-05 DIAGNOSIS — G5601 Carpal tunnel syndrome, right upper limb: Secondary | ICD-10-CM | POA: Insufficient documentation

## 2016-04-05 HISTORY — PX: CARPAL TUNNEL RELEASE: SHX101

## 2016-04-05 LAB — POCT PREGNANCY, URINE: Preg Test, Ur: NEGATIVE

## 2016-04-05 SURGERY — CARPAL TUNNEL RELEASE
Anesthesia: Monitor Anesthesia Care | Site: Hand | Laterality: Right

## 2016-04-05 MED ORDER — MIDAZOLAM HCL 2 MG/2ML IJ SOLN
0.5000 mg | Freq: Once | INTRAMUSCULAR | Status: DC | PRN
  Filled 2016-04-05: qty 2

## 2016-04-05 MED ORDER — PROPOFOL 10 MG/ML IV BOLUS
INTRAVENOUS | Status: AC
Start: 2016-04-05 — End: 2016-04-05
  Filled 2016-04-05: qty 20

## 2016-04-05 MED ORDER — FENTANYL CITRATE (PF) 100 MCG/2ML IJ SOLN
INTRAMUSCULAR | Status: AC
  Filled 2016-04-05: qty 2

## 2016-04-05 MED ORDER — KETOROLAC TROMETHAMINE 30 MG/ML IJ SOLN
INTRAMUSCULAR | Status: AC
  Filled 2016-04-05: qty 1

## 2016-04-05 MED ORDER — KETOROLAC TROMETHAMINE 30 MG/ML IJ SOLN
INTRAMUSCULAR | Status: DC | PRN
  Administered 2016-04-05: 30 mg via INTRAVENOUS

## 2016-04-05 MED ORDER — PROMETHAZINE HCL 25 MG/ML IJ SOLN
6.2500 mg | INTRAMUSCULAR | Status: DC | PRN
  Filled 2016-04-05: qty 1

## 2016-04-05 MED ORDER — MIDAZOLAM HCL 2 MG/2ML IJ SOLN
INTRAMUSCULAR | Status: AC
Start: 2016-04-05 — End: 2016-04-05
  Filled 2016-04-05: qty 2

## 2016-04-05 MED ORDER — CEFAZOLIN SODIUM-DEXTROSE 2-4 GM/100ML-% IV SOLN
2.0000 g | INTRAVENOUS | Status: AC
  Administered 2016-04-05: 2 g via INTRAVENOUS
  Filled 2016-04-05: qty 100

## 2016-04-05 MED ORDER — CEFAZOLIN SODIUM-DEXTROSE 2-4 GM/100ML-% IV SOLN
INTRAVENOUS | Status: AC
  Filled 2016-04-05: qty 100

## 2016-04-05 MED ORDER — CHLORHEXIDINE GLUCONATE 4 % EX LIQD
60.0000 mL | Freq: Once | CUTANEOUS | Status: DC
  Filled 2016-04-05: qty 118

## 2016-04-05 MED ORDER — HYDROCODONE-ACETAMINOPHEN 5-325 MG PO TABS
1.0000 | ORAL_TABLET | Freq: Four times a day (QID) | ORAL | 0 refills | Status: DC | PRN
Start: 2016-04-05 — End: 2016-08-15

## 2016-04-05 MED ORDER — FENTANYL CITRATE (PF) 100 MCG/2ML IJ SOLN
INTRAMUSCULAR | Status: DC | PRN
  Administered 2016-04-05 (×2): 50 ug via INTRAVENOUS

## 2016-04-05 MED ORDER — SODIUM BICARBONATE 4 % IV SOLN
INTRAVENOUS | Status: DC | PRN
  Administered 2016-04-05: 22 mL via INTRAMUSCULAR

## 2016-04-05 MED ORDER — MIDAZOLAM HCL 5 MG/5ML IJ SOLN
INTRAMUSCULAR | Status: DC | PRN
  Administered 2016-04-05 (×2): 1 mg via INTRAVENOUS

## 2016-04-05 MED ORDER — LACTATED RINGERS IV SOLN
INTRAVENOUS | Status: DC
  Administered 2016-04-05: 09:00:00 via INTRAVENOUS
  Filled 2016-04-05: qty 1000

## 2016-04-05 MED ORDER — FENTANYL CITRATE (PF) 100 MCG/2ML IJ SOLN
25.0000 ug | INTRAMUSCULAR | Status: DC | PRN
  Filled 2016-04-05: qty 1

## 2016-04-05 MED FILL — HYDROCODON-APAP 5-325: 5-325 | 7 days supply | Qty: 30 | Fill #0

## 2016-04-05 SURGICAL SUPPLY — 50 items
BANDAGE ACE 3X5.8 VEL STRL LF (GAUZE/BANDAGES/DRESSINGS) ×2 IMPLANT
BLADE SURG 15 STRL LF DISP TIS (BLADE) ×1 IMPLANT
BLADE SURG 15 STRL SS (BLADE) ×1
BNDG CONFORM 3 STRL LF (GAUZE/BANDAGES/DRESSINGS) ×4 IMPLANT
BRUSH SCRUB EZ PLAIN DRY (MISCELLANEOUS) ×2 IMPLANT
CORDS BIPOLAR (ELECTRODE) ×2 IMPLANT
COVER BACK TABLE 60X90IN (DRAPES) ×2 IMPLANT
COVER MAYO STAND STRL (DRAPES) ×2 IMPLANT
CUFF TOURNIQUET SINGLE 18IN (TOURNIQUET CUFF) ×2 IMPLANT
DRAPE EXTREMITY T 121X128X90 (DRAPE) ×2 IMPLANT
DRAPE LG THREE QUARTER DISP (DRAPES) ×2 IMPLANT
DRAPE SURG 17X23 STRL (DRAPES) ×4 IMPLANT
DRSG EMULSION OIL 3X3 NADH (GAUZE/BANDAGES/DRESSINGS) ×2 IMPLANT
ELECT REM PT RETURN 9FT ADLT (ELECTROSURGICAL)
ELECTRODE REM PT RTRN 9FT ADLT (ELECTROSURGICAL) IMPLANT
GAUZE SPONGE 4X4 12PLY STRL (GAUZE/BANDAGES/DRESSINGS) ×2 IMPLANT
GAUZE SPONGE 4X4 16PLY XRAY LF (GAUZE/BANDAGES/DRESSINGS) IMPLANT
GAUZE XEROFORM 1X8 LF (GAUZE/BANDAGES/DRESSINGS) ×2 IMPLANT
GLOVE BIOGEL M STRL SZ7.5 (GLOVE) ×2 IMPLANT
GLOVE SS BIOGEL STRL SZ 8 (GLOVE) ×1 IMPLANT
GLOVE SUPERSENSE BIOGEL SZ 8 (GLOVE) ×1
GOWN BRE IMP SLV AUR XL STRL (GOWN DISPOSABLE) ×4 IMPLANT
GOWN STRL REUS W/ TWL LRG LVL3 (GOWN DISPOSABLE) ×1 IMPLANT
GOWN STRL REUS W/TWL LRG LVL3 (GOWN DISPOSABLE) ×1
KIT ROOM TURNOVER WOR (KITS) ×2 IMPLANT
NDL SAFETY ECLIPSE 18X1.5 (NEEDLE) ×1 IMPLANT
NEEDLE HYPO 18GX1.5 SHARP (NEEDLE) ×1
NEEDLE HYPO 22GX1.5 SAFETY (NEEDLE) IMPLANT
NEEDLE HYPO 25X1 1.5 SAFETY (NEEDLE) ×4 IMPLANT
NS IRRIG 500ML POUR BTL (IV SOLUTION) ×2 IMPLANT
PACK BASIN DAY SURGERY FS (CUSTOM PROCEDURE TRAY) ×2 IMPLANT
PAD CAST 3X4 CTTN HI CHSV (CAST SUPPLIES) ×2 IMPLANT
PADDING CAST ABS 3INX4YD NS (CAST SUPPLIES) ×1
PADDING CAST ABS 4INX4YD NS (CAST SUPPLIES) ×1
PADDING CAST ABS COTTON 3X4 (CAST SUPPLIES) ×1 IMPLANT
PADDING CAST ABS COTTON 4X4 ST (CAST SUPPLIES) ×1 IMPLANT
PADDING CAST COTTON 3X4 STRL (CAST SUPPLIES) ×2
PENCIL BUTTON HOLSTER BLD 10FT (ELECTRODE) IMPLANT
SPLINT PLASTER CAST XFAST 3X15 (CAST SUPPLIES) IMPLANT
SPLINT PLASTER XTRA FASTSET 3X (CAST SUPPLIES)
SPONGE GAUZE 4X4 12PLY (GAUZE/BANDAGES/DRESSINGS) ×2 IMPLANT
STOCKINETTE 4X48 STRL (DRAPES) ×2 IMPLANT
SUT PROLENE 4 0 PS 2 18 (SUTURE) ×4 IMPLANT
SWAB ALCOHOL LATEX FREE (MISCELLANEOUS) ×16 IMPLANT
SYR BULB 3OZ (MISCELLANEOUS) ×2 IMPLANT
SYR CONTROL 10ML LL (SYRINGE) ×4 IMPLANT
TOWEL OR 17X24 6PK STRL BLUE (TOWEL DISPOSABLE) ×4 IMPLANT
TOWEL OR NON WOVEN STRL DISP B (DISPOSABLE) ×6 IMPLANT
TRAY DSU PREP LF (CUSTOM PROCEDURE TRAY) ×2 IMPLANT
UNDERPAD 30X30 INCONTINENT (UNDERPADS AND DIAPERS) ×2 IMPLANT

## 2016-04-05 NOTE — Addendum Note (Signed)
Addendum  created 04/05/16 1329 by Mechele Claude, CRNA   Anesthesia Staff edited, Charge Capture section accepted

## 2016-04-05 NOTE — Anesthesia Preprocedure Evaluation (Signed)
Anesthesia Evaluation  Patient identified by MRN, date of birth, ID band Patient awake    Reviewed: Allergy & Precautions, NPO status , Patient's Chart, lab work & pertinent test results  History of Anesthesia Complications Negative for: history of anesthetic complications  Airway Mallampati: I  TM Distance: >3 FB Neck ROM: Full    Dental  (+) Dental Advisory Given   Pulmonary neg pulmonary ROS,    breath sounds clear to auscultation       Cardiovascular negative cardio ROS   Rhythm:Regular Rate:Normal     Neuro/Psych negative neurological ROS     GI/Hepatic negative GI ROS, Neg liver ROS,   Endo/Other  negative endocrine ROS  Renal/GU negative Renal ROS     Musculoskeletal negative musculoskeletal ROS (+)   Abdominal   Peds  Hematology negative hematology ROS (+)   Anesthesia Other Findings   Reproductive/Obstetrics Pt is nursing                             Anesthesia Physical Anesthesia Plan  ASA: I  Anesthesia Plan: MAC   Post-op Pain Management:    Induction: Intravenous  Airway Management Planned: Natural Airway and Nasal Cannula  Additional Equipment:   Intra-op Plan:   Post-operative Plan:   Informed Consent: I have reviewed the patients History and Physical, chart, labs and discussed the procedure including the risks, benefits and alternatives for the proposed anesthesia with the patient or authorized representative who has indicated his/her understanding and acceptance.   Dental advisory given  Plan Discussed with: CRNA and Surgeon  Anesthesia Plan Comments: (Plan routine monitors, MAC Pt understands to dispose of milk while taking pain meds)        Anesthesia Quick Evaluation

## 2016-04-05 NOTE — Op Note (Signed)
See full dictation Procedure:#1 median nerve/peripheral nerve block at the wrist form level for anesthetic purposes for carpal tunnel release #2 carpal tunnel release right upper extremity  Surgeon Marleen Moret M.D.  Asst. Avelina Laine. PA-C  Patient tolerated procedure well without problematic feature  Will plan for standard postop algorithm  Shama Monfils M.D.

## 2016-04-05 NOTE — H&P (Signed)
[name] is an 35 y.o. female.   Chief Complaint: Right carpal tunnel syndrome HPI: The patient is a pleasant 35 year old female who presents for a right limited open carpal tunnel release. The patient has had a history of carpal tunnel syndrome about the right upper extremity and has failed conservative measures. The patient desires to proceed with surgical intervention. We have counseled her at length in regards to the surgery. Questions were encouraged and answered  Past Medical History:  Diagnosis Date  . H/O varicella   . History of concussion    age 89  no residual  . History of pregnancy induced hypertension   . Right carpal tunnel syndrome   . Wears contact lenses     Past Surgical History:  Procedure Laterality Date  . DILATION AND EVACUATION  03/19/2012   Procedure: DILATATION AND EVACUATION;  Surgeon: Floyce Stakes. Pamala Hurry, MD;  Location: [facility];  Service: Gynecology;  Laterality: N/A;  With Ultrasound  . EXCISIONAL BIOSPY LEFT DEEP NECK MASS  10/15/2007   Benign salivary gland tissue w/ chronic inflammation  . WISDOM TOOTH EXTRACTION      Family History  Problem Relation Age of Onset  . Migraines Mother   . Hyperlipidemia Mother   . Arthritis Mother   . Heart murmur Mother   . Glaucoma Father   . Cancer Maternal Aunt 34    breast  . Heart attack Maternal Grandmother   . Heart disease Maternal Grandfather   . Hyperlipidemia Paternal Grandmother   . Heart attack Paternal Grandfather   . Other Neg Hx    Social History:  reports that she has never smoked. She has never used smokeless tobacco. She reports that she does not drink alcohol or use drugs.  Allergies: No Known Allergies  Medications Prior to Admission  Medication Sig Dispense Refill  . adapalene (DIFFERIN) 0.1 % gel Apply topically at bedtime.    . Dapsone (ACZONE) 5 % topical gel Apply topically daily.    . Prenatal Vit-Fe Fumarate-FA (PRENATAL MULTIVITAMIN) TABS tablet Take 1 tablet by mouth at  bedtime.       Results for orders placed or performed during the hospital encounter of 04/05/16 (from the past 48 hour(s))  Pregnancy, urine POC     Status: None   Collection Time: 04/05/16  8:59 AM  Result Value Ref Range   Preg Test, Ur NEGATIVE NEGATIVE    Comment:        THE SENSITIVITY OF THIS METHODOLOGY IS >24 mIU/mL    No results found.  Review of Systems  Constitutional: Negative.   HENT: Negative.   Eyes: Negative.   Respiratory: Negative.   Cardiovascular: Negative.        History of systolic murmur  Musculoskeletal:       See history of present illness History of low back pain with mild scoliosis  Skin: Negative.     Blood pressure 123/80, pulse (!) 57, temperature 98.2 F (36.8 C), temperature source Oral, resp. rate 14, height 5' 5.75" (1.67 m), weight 67.1 kg (148 lb), SpO2 100 %, currently breastfeeding. Physical Exam  The patient is alert and oriented in no acute distress. The patient complains of pain in the affected upper extremity.  The patient is noted to have a normal HEENT exam. Lung fields show equal chest expansion and no shortness of breath. Abdomen exam is nontender without distention. Lower extremity examination does not show any fracture dislocation or blood clot symptoms. Pelvis is stable and  the neck and back are stable and nontender. Evaluation of the right upper extremity shows that she has positive findings consistent with carpal tunnel syndrome with positive median nerve compression, positive Tinel's and Phalen's.  Assessment/Plan Right carpal tunnel syndrome Patient Active Problem List   Diagnosis Date Noted  . Low back pain 12/01/2015  . Mild scoliosis 12/01/2015  . Lipoma of back 12/01/2015  . Postpartum care following vaginal delivery (8/10) 03/19/2015  . Gestational hypertension 03/18/2015  . Postpartum state 03/18/2015  . Spotting affecting pregnancy in first trimester 08/31/2014  . COMMON MIGRAINE 12/18/2007  . SYSTOLIC  MURMUR 0000000  We are planning surgery for your upper extremity. The risk and benefits of surgery to include risk of bleeding, infection, anesthesia,  damage to normal structures and failure of the surgery to accomplish its intended goals of relieving symptoms and restoring function have been discussed in detail. With this in mind we plan to proceed. I have specifically discussed with the patient the pre-and postoperative regime and the dos and don'ts and risk and benefits in great detail. Risk and benefits of surgery also include risk of dystrophy(CRPS), chronic nerve pain, failure of the healing process to go onto completion and other inherent risks of surgery The relavent the pathophysiology of the disease/injury process, as well as the alternatives for treatment and postoperative course of action has been discussed in great detail with the patient who desires to proceed.  We will do everything in our power to help you (the patient) restore function to the upper extremity. It is a pleasure to see this patient today.   Gionni Freese L, PA-C 04/05/2016, 9:02 AM

## 2016-04-05 NOTE — Transfer of Care (Signed)
  Last Vitals:  Vitals:   04/05/16 0844  BP: 123/80  Pulse: (!) 57  Resp: 14  Temp: 36.8 C    Last Pain:  Vitals:   04/05/16 0844  TempSrc: Oral      Patients Stated Pain Goal: 7 (04/05/16 0853)   Anesthesia Transfer of Care Note  Patient: [name]  Procedure(s) Performed: Procedure(s) (LRB): RIGHT LIMITED OPEN CARPAL TUNNEL RELEASE (Right)  Patient Location: Phase 2  Anesthesia Type: MAC  Level of Consciousness: awake, alert  and oriented  Airway & Oxygen Therapy: Patient Spontanous Breathing and Patient   Post-op Assessment: Report given to PACU RN and Post -op Vital signs reviewed and stable  Post vital signs: Reviewed and stable  Complications: No apparent anesthesia complications

## 2016-04-05 NOTE — Anesthesia Procedure Notes (Signed)
Procedure Name: MAC Date/Time: 04/05/2016 10:32 AM Performed by: Bethena Roys T Pre-anesthesia Checklist: Patient identified, Emergency Drugs available, Suction available, Patient being monitored and Timeout performed Oxygen Delivery Method: Nasal cannula Placement Confirmation: positive ETCO2

## 2016-04-05 NOTE — Anesthesia Postprocedure Evaluation (Signed)
Anesthesia Post Note  Patient: [name]  Procedure(s) Performed: Procedure(s) (LRB): RIGHT LIMITED OPEN CARPAL TUNNEL RELEASE (Right)  Patient location during evaluation: PACU Anesthesia Type: MAC Level of consciousness: awake and alert, oriented and patient cooperative Pain management: pain level controlled Vital Signs Assessment: post-procedure vital signs reviewed and stable Respiratory status: spontaneous breathing, nonlabored ventilation and respiratory function stable Cardiovascular status: stable and blood pressure returned to baseline Postop Assessment: no signs of nausea or vomiting Anesthetic complications: no    Last Vitals:  Vitals:   04/05/16 1122 04/05/16 1214  BP: 120/74 119/72  Pulse: (!) 59 62  Resp: 16 16  Temp: 37 C     Last Pain:  Vitals:   04/05/16 0844  TempSrc: Oral                 Raeley Gilmore,E. Beatrice Sehgal

## 2016-04-05 NOTE — Discharge Instructions (Signed)
.  Keep bandage clean and dry.  Call for any problems.  No smoking.  Criteria for driving a car: you should be off your pain medicine for 7-8 hours, able to drive one handed(confident), thinking clearly and feeling able in your judgement to drive. Continue elevation as it will decrease swelling.  If instructed by MD move your fingers within the confines of the bandage/splint.  Use ice if instructed by your MD. Call immediately for any sudden loss of feeling in your hand/arm or change in functional abilities of the extremity. We recommend that you to take vitamin C 1000 mg a day to promote healing. We also recommend that if you require  pain medicine that you take a stool softener to prevent constipation as most pain medicines will have constipation side effects. We recommend either Peri-Colace or Senokot and recommend that you also consider adding MiraLAX as well to prevent the constipation affects from pain medicine if you are required to use them. These medicines are over the counter and may be purchased at a local pharmacy. A cup of yogurt and a probiotic can also be helpful during the recovery process as the medicines can disrupt your intestinal environment.  Post Anesthesia Home Care Instructions  Activity: Get plenty of rest for the remainder of the day. A responsible adult should stay with you for 24 hours following the procedure.  For the next 24 hours, DO NOT: -Drive a car -Paediatric nurse -Drink alcoholic beverages -Take any medication unless instructed by your physician -Make any legal decisions or sign important papers.  Meals: Start with liquid foods such as gelatin or soup. Progress to regular foods as tolerated. Avoid greasy, spicy, heavy foods. If nausea and/or vomiting occur, drink only clear liquids until the nausea and/or vomiting subsides. Call your physician if vomiting continues.  Special Instructions/Symptoms: Your throat may feel dry or sore from the anesthesia or the  breathing tube placed in your throat during surgery. If this causes discomfort, gargle with warm salt water. The discomfort should disappear within 24 hours.  If you had a scopolamine patch placed behind your ear for the management of post- operative nausea and/or vomiting:  1. The medication in the patch is effective for 72 hours, after which it should be removed.  Wrap patch in a tissue and discard in the trash. Wash hands thoroughly with soap and water. 2. You may remove the patch earlier than 72 hours if you experience unpleasant side effects which may include dry mouth, dizziness or visual disturbances. 3. Avoid touching the patch. Wash your hands with soap and water after contact with the patch.

## 2016-04-06 ENCOUNTER — Encounter (HOSPITAL_BASED_OUTPATIENT_CLINIC_OR_DEPARTMENT_OTHER): Payer: Self-pay | Admitting: Orthopedic Surgery

## 2016-04-06 NOTE — Op Note (Signed)
[name], [name]              ACCOUNT NO.:  [id number]  MEDICAL RECORD NO.:  [id number]  LOCATION:                                 FACILITY:  PHYSICIAN:  Satira Anis. Eisa Necaise, M.D.DATE OF BIRTH:  28-Dec-1980  DATE OF PROCEDURE: DATE OF DISCHARGE:                              OPERATIVE REPORT   PREOPERATIVE DIAGNOSIS:  Right carpal tunnel syndrome, severe in nature.  POSTOPERATIVE DIAGNOSIS:  Right carpal tunnel syndrome, severe in nature.  PROCEDURES: 1. Right median nerve/peripheral nerve block at the wrist-forearm     level for anesthetic purposes for carpal tunnel release. 2. Right limited open carpal tunnel release.  SURGEON:  Satira Anis. Amedeo Plenty, M.D.  ASSIST:  Avelina Laine, P.A.-C.  COMPLICATIONS:  None.  ANESTHESIA:  Peripheral nerve block with IV sedation keeping the patient awake, alert and oriented the entire case.  TOURNIQUET TIME:  Less than 20 minutes.  INDICATIONS:  Pleasant female, 35 years of age with bilateral carpal tunnel syndrome.  She understands the risks and benefits of surgery and desires to proceed.  OPERATION IN DETAIL:  Operative procedure was explained to the patient. She was taken to the procedure suite and underwent light IV sedation, was kept awake, alert, and oriented and underwent a smooth induction of peripheral nerve/median nerve block.  She was then prepped and draped in usual sterile fashion with Betadine scrub and paint, tolerated this well.  Following this, the patient underwent very careful and cautious approach to the extremity with elevation of the tourniquet and a 1.5-2 cm incision was made distal to the transverse carpal ligament. Dissection was carried down.  Palmar fascia was incised.  Distal edge of the transverse carpal ligament was released under 4.0 loupe magnification.  Fat pad egressed nicely.  Distal to proximal dissection was carried out until adequate room was available for sliding of the rhytidectomy/Jarit  scissors.  This was done under direct 4.5 loupe magnification with light aid.  The patient tolerated this well.  She was wake, alert and oriented during all points of passes and had complete release of the proximal leaflet and portions of the antebrachial fascia without difficulty.  I inspected the canal, deflated the tourniquet, secured hemostasis and closed the wound with Prolene.  Sterile dressing was applied.  She was taken to the recovery room in stable condition. We will see her back in a week, therapy in 12-14 days and our standard postop algorithm will be adhered to.  Pleasure to see her today and participate in her care plan.  Do's and don'ts have been discussed and all questions addressed.     Satira Anis. Amedeo Plenty, M.D.   ______________________________ Satira Anis. Amedeo Plenty, M.D.    [facility]  D:  04/05/2016  T:  04/06/2016  Job:  [id number]

## 2016-04-07 LAB — POCT HEMOGLOBIN-HEMACUE: Hemoglobin: 12.9 g/dL (ref 12.0–15.0)

## 2016-04-12 DIAGNOSIS — Z4789 Encounter for other orthopedic aftercare: Secondary | ICD-10-CM | POA: Diagnosis not present

## 2016-04-20 DIAGNOSIS — G5601 Carpal tunnel syndrome, right upper limb: Secondary | ICD-10-CM | POA: Diagnosis not present

## 2016-05-05 DIAGNOSIS — G5601 Carpal tunnel syndrome, right upper limb: Secondary | ICD-10-CM | POA: Diagnosis not present

## 2016-05-05 DIAGNOSIS — Z4789 Encounter for other orthopedic aftercare: Secondary | ICD-10-CM | POA: Diagnosis not present

## 2016-05-23 ENCOUNTER — Encounter: Payer: Self-pay | Admitting: Family Medicine

## 2016-05-23 ENCOUNTER — Ambulatory Visit (INDEPENDENT_AMBULATORY_CARE_PROVIDER_SITE_OTHER): Payer: 59 | Admitting: Family Medicine

## 2016-05-23 VITALS — BP 114/73 | HR 72 | Temp 98.6°F | Ht 65.5 in | Wt 149.5 lb

## 2016-05-23 DIAGNOSIS — A084 Viral intestinal infection, unspecified: Secondary | ICD-10-CM

## 2016-05-23 NOTE — Progress Notes (Signed)
Dr. Frederico Hamman T. Avyan Livesay, MD, [facility] [place], [address] Phone: [phone] Fax: [phone]  05/23/2016  Patient: [name], MRN: [id number], DOB: 08-Jul-1981, 35 y.o.  Primary Physician:  Eliezer Lofts, MD   Chief Complaint  Patient presents with  . Abdominal Pain    Started on Saturday  . Diarrhea   Subjective:   [name] is a 35 y.o. very pleasant female patient who presents with the following:  Overnight Friday to sat and through the weekend. Nauseated and intermittent diarrhea. Felt a little bit better yesterday - and a little bit of diarrhea and abd pain and bloating.   No known exposures - son threw up one time 5 days prior.  Watery diarrhea.  No blood.  Keeping some liquids down, then not so much nausea anymore.   Past Medical History, Surgical History, Social History, Family History, Problem List, Medications, and Allergies have been reviewed and updated if relevant.  Patient Active Problem List   Diagnosis Date Noted  . Low back pain 12/01/2015  . Mild scoliosis 12/01/2015  . Lipoma of back 12/01/2015  . Postpartum care following vaginal delivery (8/10) 03/19/2015  . Gestational hypertension 03/18/2015  . Postpartum state 03/18/2015  . Spotting affecting pregnancy in first trimester 08/31/2014  . COMMON MIGRAINE 12/18/2007  . SYSTOLIC MURMUR 0000000    Past Medical History:  Diagnosis Date  . H/O varicella   . History of concussion    age 41  no residual  . History of pregnancy induced hypertension   . Right carpal tunnel syndrome   . Wears contact lenses     Past Surgical History:  Procedure Laterality Date  . CARPAL TUNNEL RELEASE Right 04/05/2016   Procedure: RIGHT LIMITED OPEN CARPAL TUNNEL RELEASE;  Surgeon: Roseanne Kaufman, MD;  Location: [place];  Service: Orthopedics;  Laterality: Right;  . DILATION AND EVACUATION  03/19/2012   Procedure:  DILATATION AND EVACUATION;  Surgeon: Floyce Stakes. Pamala Hurry, MD;  Location: [facility];  Service: Gynecology;  Laterality: N/A;  With Ultrasound  . EXCISIONAL BIOSPY LEFT DEEP NECK MASS  10/15/2007   Benign salivary gland tissue w/ chronic inflammation  . WISDOM TOOTH EXTRACTION      Social History   Social History  . Marital status: Married    Spouse name: N/A  . Number of children: N/A  . Years of education: N/A   Occupational History  . Not on file.   Social History Main Topics  . Smoking status: Never Smoker  . Smokeless tobacco: Never Used  . Alcohol use No  . Drug use: No  . Sexual activity: Yes    Birth control/ protection: IUD     Comment: Mirena IUD placed Oct 2016   Other Topics Concern  . Not on file   Social History Narrative   Married, 2 children   Exercise: minimal   Diet: Healthy    Family History  Problem Relation Age of Onset  . Migraines Mother   . Hyperlipidemia Mother   . Arthritis Mother   . Heart murmur Mother   . Glaucoma Father   . Cancer Maternal Aunt 34    breast  . Heart attack Maternal Grandmother   . Heart disease Maternal Grandfather   . Hyperlipidemia Paternal Grandmother   . Heart attack Paternal Grandfather   . Other Neg Hx     No Known Allergies  Medication list reviewed and updated in  full in [facility].   GEN: No acute illnesses, no fevers, chills. GI: No n/v/d, eating normally Pulm: No SOB Interactive and getting along well at home.  Otherwise, ROS is as per the HPI.  Objective:   BP 114/73   Pulse 72   Temp 98.6 F (37 C) (Oral)   Ht 5' 5.5" (1.664 m)   Wt 149 lb 8 oz (67.8 kg)   BMI 24.50 kg/m   GEN: WDWN, NAD, Non-toxic, A & O x 3 HEENT: Atraumatic, Normocephalic. Neck supple. No masses, No LAD. Ears and Nose: No external deformity. CV: RRR, No M/G/R. No JVD. No thrill. No extra heart sounds. PULM: CTA B, no wheezes, crackles, rhonchi. No retractions. No resp. distress. No accessory muscle use. ABD: S,  NT, ND, hyperactive BS, No rebound, No HSM  EXTR: No c/c/e NEURO Normal gait.  PSYCH: Normally interactive. Conversant. Not depressed or anxious appearing.  Calm demeanor.   Laboratory and Imaging Data:  Assessment and Plan:   Viral gastroenteritis  Advance diet as tol Po fluids  Follow-up: No Follow-up on file.  Signed,  Maud Deed. Taryn Shellhammer, MD   Patient's Medications  New Prescriptions   No medications on file  Previous Medications   ADAPALENE (DIFFERIN) 0.1 % GEL    Apply topically at bedtime.   DAPSONE (ACZONE) 5 % TOPICAL GEL    Apply topically daily.   HYDROCODONE-ACETAMINOPHEN (NORCO) 5-325 MG TABLET    Take 1 tablet by mouth every 6 (six) hours as needed for moderate pain.   PRENATAL VIT-FE FUMARATE-FA (PRENATAL MULTIVITAMIN) TABS TABLET    Take 1 tablet by mouth at bedtime.   Modified Medications   No medications on file  Discontinued Medications   No medications on file

## 2016-05-23 NOTE — Progress Notes (Signed)
Pre visit review using our clinic review tool, if applicable. No additional management support is needed unless otherwise documented below in the visit note. 

## 2016-05-27 MED FILL — ADAPALENE 0.1% GEL: 0.1 | 30 days supply | Qty: 45 | Fill #1

## 2016-05-27 MED FILL — ACZONE 7.5% GEL PUMP: 7.5 | 90 days supply | Qty: 90 | Fill #1

## 2016-06-01 DIAGNOSIS — Z01419 Encounter for gynecological examination (general) (routine) without abnormal findings: Secondary | ICD-10-CM | POA: Diagnosis not present

## 2016-06-01 DIAGNOSIS — Z30432 Encounter for removal of intrauterine contraceptive device: Secondary | ICD-10-CM | POA: Diagnosis not present

## 2016-06-01 DIAGNOSIS — Z6824 Body mass index (BMI) 24.0-24.9, adult: Secondary | ICD-10-CM | POA: Diagnosis not present

## 2016-06-02 MED FILL — TARON-C DHA CAPSULE: 53.5-38-1 | 90 days supply | Qty: 90 | Fill #0

## 2016-06-23 DIAGNOSIS — Z0100 Encounter for examination of eyes and vision without abnormal findings: Secondary | ICD-10-CM | POA: Diagnosis not present

## 2016-07-07 DIAGNOSIS — Z3141 Encounter for fertility testing: Secondary | ICD-10-CM | POA: Diagnosis not present

## 2016-07-07 DIAGNOSIS — N979 Female infertility, unspecified: Secondary | ICD-10-CM | POA: Diagnosis not present

## 2016-07-07 MED FILL — ESTRADIOL 0.1 MG PATCH: 0.1 | 48 days supply | Qty: 16 | Fill #0

## 2016-07-07 MED FILL — ESTRADIOL 2 MG TABLET: 2 | 30 days supply | Qty: 60 | Fill #0

## 2016-07-07 MED FILL — METHYLPREDNISOLONE 4 MG TAB: 4 | 4 days supply | Qty: 16 | Fill #0

## 2016-08-08 ENCOUNTER — Encounter (HOSPITAL_COMMUNITY): Payer: Self-pay | Admitting: Emergency Medicine

## 2016-08-08 ENCOUNTER — Ambulatory Visit (HOSPITAL_COMMUNITY)
Admission: EM | Admit: 2016-08-08 | Discharge: 2016-08-08 | Disposition: A | Discharge status: Home / Self Care | Payer: 59 | Attending: Family Medicine | Admitting: Family Medicine

## 2016-08-08 ENCOUNTER — Ambulatory Visit (INDEPENDENT_AMBULATORY_CARE_PROVIDER_SITE_OTHER): Payer: 59

## 2016-08-08 DIAGNOSIS — M25522 Pain in left elbow: Secondary | ICD-10-CM

## 2016-08-08 DIAGNOSIS — W19XXXA Unspecified fall, initial encounter: Secondary | ICD-10-CM

## 2016-08-08 DIAGNOSIS — S59902A Unspecified injury of left elbow, initial encounter: Secondary | ICD-10-CM | POA: Diagnosis not present

## 2016-08-08 DIAGNOSIS — G5602 Carpal tunnel syndrome, left upper limb: Secondary | ICD-10-CM

## 2016-08-08 HISTORY — DX: Carpal tunnel syndrome, left upper limb: G56.02

## 2016-08-08 MED ORDER — MELOXICAM 15 MG PO TABS: 15.0000 mg | ORAL_TABLET | Freq: Every day | ORAL | 0 refills | Status: DC

## 2016-08-08 NOTE — Discharge Instructions (Signed)
There were no acute findings on Xray. You have been given a prescription for an antiinflammatory drug to take daily. Rest the affected arm, use compression, ice, and elevation. Should symptoms fail to resolve or worsen follow up with your primary care provider or return to clinic.

## 2016-08-08 NOTE — ED Triage Notes (Signed)
PT fell down a full set of stairs and injured left elbow. PT reports pain is worst when elbow is fully extended

## 2016-08-08 NOTE — ED Provider Notes (Signed)
CSN: [id number]     Arrival date & time 08/08/16  1553 History   None    Chief Complaint  Patient presents with  . Fall   (Consider location/radiation/quality/duration/timing/severity/associated sxs/prior Treatment) 35 year old female presents with chief complaint of pain in the left elbow secondary to fall. Patient reports she fell down a flight of stairs landing on her left side. Patient denies LOC, denies dizziness, headache, nausea, vomiting, or other red flag symptoms. States the pain in her elbow is worse with full extension. Denies any other complaint   The history is provided by the patient.    Past Medical History:  Diagnosis Date  . H/O varicella   . History of concussion    age 70  no residual  . History of pregnancy induced hypertension   . Right carpal tunnel syndrome   . Wears contact lenses    Past Surgical History:  Procedure Laterality Date  . CARPAL TUNNEL RELEASE Right 04/05/2016   Procedure: RIGHT LIMITED OPEN CARPAL TUNNEL RELEASE;  Surgeon: Roseanne Kaufman, MD;  Location: [facility];  Service: Orthopedics;  Laterality: Right;  . DILATION AND EVACUATION  03/19/2012   Procedure: DILATATION AND EVACUATION;  Surgeon: Floyce Stakes. Pamala Hurry, MD;  Location: [facility];  Service: Gynecology;  Laterality: N/A;  With Ultrasound  . EXCISIONAL BIOSPY LEFT DEEP NECK MASS  10/15/2007   Benign salivary gland tissue w/ chronic inflammation  . WISDOM TOOTH EXTRACTION     Family History  Problem Relation Age of Onset  . Migraines Mother   . Hyperlipidemia Mother   . Arthritis Mother   . Heart murmur Mother   . Glaucoma Father   . Cancer Maternal Aunt 34    breast  . Heart attack Maternal Grandmother   . Heart disease Maternal Grandfather   . Hyperlipidemia Paternal Grandmother   . Heart attack Paternal Grandfather   . Other Neg Hx    Social History  Substance Use Topics  . Smoking status: Never Smoker  . Smokeless tobacco: Never Used  . Alcohol use No   OB  History    Gravida Para Term Preterm AB Living   3 3 3     3    SAB TAB Ectopic Multiple Live Births         0 3     Review of Systems  Constitutional: Negative.   HENT: Negative.   Eyes: Negative.   Respiratory: Negative.   Cardiovascular: Negative.   Gastrointestinal: Negative.   Musculoskeletal: Positive for joint swelling (pain in the left elbow).  Skin: Negative.     Allergies  Patient has no known allergies.  Home Medications   Prior to Admission medications   Medication Sig Start Date End Date Taking? Authorizing Provider  adapalene (DIFFERIN) 0.1 % gel Apply topically at bedtime.    Historical Provider, MD  Dapsone (ACZONE) 5 % topical gel Apply topically daily.    Historical Provider, MD  HYDROcodone-acetaminophen (NORCO) 5-325 MG tablet Take 1 tablet by mouth every 6 (six) hours as needed for moderate pain. 04/05/16   Avelina Laine, PA-C  meloxicam (MOBIC) 15 MG tablet Take 1 tablet (15 mg total) by mouth daily. 08/08/16 08/18/16  Barnet Glasgow, NP  Prenatal Vit-Fe Fumarate-FA (PRENATAL MULTIVITAMIN) TABS tablet Take 1 tablet by mouth at bedtime.     Historical Provider, MD   Meds Ordered and Administered this Visit  Medications - No data to display  BP 130/81   Pulse 71   Temp 98.6  F (37 C) (Oral)   Resp 16   Ht 5\' 6"  (1.676 m)   Wt 150 lb (68 kg)   LMP 07/18/2016   SpO2 100%   BMI 24.21 kg/m  No data found.   Physical Exam  Constitutional: She is oriented to person, place, and time. She appears well-developed and well-nourished. No distress.  HENT:  Head: Normocephalic.  Neck: Normal range of motion. Neck supple. No JVD present.  Cardiovascular: Normal rate and regular rhythm.   Pulmonary/Chest: Effort normal and breath sounds normal.  Abdominal: Soft. Bowel sounds are normal.  Musculoskeletal:       Arms: Lymphadenopathy:    She has no cervical adenopathy.  Neurological: She is alert and oriented to person, place, and time.  Skin: Skin is warm  and dry. Capillary refill takes less than 2 seconds. She is not diaphoretic.  Psychiatric: She has a normal mood and affect.  Nursing note and vitals reviewed.   Urgent Care Course   Clinical Course     Procedures (including critical care time)  Labs Review Labs Reviewed - No data to display  Imaging Review Dg Elbow Complete Left  Result Date: 08/08/2016 CLINICAL DATA:  Status post fall today with left elbow pain. EXAM: LEFT ELBOW - COMPLETE 3+ VIEW COMPARISON:  None. FINDINGS: There is no evidence of fracture, dislocation, or joint effusion. There is no evidence of arthropathy or other focal bone abnormality. Soft tissues are unremarkable. IMPRESSION: Negative. Electronically Signed   By: Abelardo Diesel M.D.   On: 08/08/2016 17:35     Visual Acuity Review  Right Eye Distance:   Left Eye Distance:   Bilateral Distance:    Right Eye Near:   Left Eye Near:    Bilateral Near:         MDM   1. Fall, initial encounter   2. Elbow pain, left    Xray results reviewed along with radiologist report. Patient given and RX for Mobic and advised to rest, ice, compress and elevate the affected extremity. Should symptoms fail to improve or worsen follow up with PCP or return to clinic.    Barnet Glasgow, NP 08/08/16 1745

## 2016-08-15 ENCOUNTER — Ambulatory Visit: Payer: Self-pay | Admitting: Orthopedic Surgery

## 2016-08-15 ENCOUNTER — Encounter (HOSPITAL_BASED_OUTPATIENT_CLINIC_OR_DEPARTMENT_OTHER): Payer: Self-pay | Admitting: *Deleted

## 2016-08-19 ENCOUNTER — Encounter (HOSPITAL_BASED_OUTPATIENT_CLINIC_OR_DEPARTMENT_OTHER): Payer: Self-pay | Admitting: Certified Registered"

## 2016-08-19 ENCOUNTER — Ambulatory Visit (HOSPITAL_BASED_OUTPATIENT_CLINIC_OR_DEPARTMENT_OTHER): Payer: 59 | Admitting: Certified Registered"

## 2016-08-19 ENCOUNTER — Ambulatory Visit (HOSPITAL_BASED_OUTPATIENT_CLINIC_OR_DEPARTMENT_OTHER)
Admission: RE | Admit: 2016-08-19 | Discharge: 2016-08-19 | Disposition: A | Discharge status: Home / Self Care | Payer: 59 | Source: Ambulatory Visit | Attending: Orthopedic Surgery | Admitting: Orthopedic Surgery

## 2016-08-19 ENCOUNTER — Encounter (HOSPITAL_BASED_OUTPATIENT_CLINIC_OR_DEPARTMENT_OTHER)
Admission: RE | Disposition: A | Discharge status: Home / Self Care | Payer: Self-pay | Source: Ambulatory Visit | Attending: Orthopedic Surgery

## 2016-08-19 DIAGNOSIS — Z79899 Other long term (current) drug therapy: Secondary | ICD-10-CM | POA: Diagnosis not present

## 2016-08-19 DIAGNOSIS — G5602 Carpal tunnel syndrome, left upper limb: Secondary | ICD-10-CM | POA: Diagnosis not present

## 2016-08-19 HISTORY — PX: CARPAL TUNNEL RELEASE: SHX101

## 2016-08-19 HISTORY — DX: Carpal tunnel syndrome, left upper limb: G56.02

## 2016-08-19 SURGERY — CARPAL TUNNEL RELEASE
Anesthesia: Monitor Anesthesia Care | Site: Wrist | Laterality: Left

## 2016-08-19 MED ORDER — MIDAZOLAM HCL 2 MG/2ML IJ SOLN
1.0000 mg | INTRAMUSCULAR | Status: DC | PRN
  Administered 2016-08-19: 1 mg via INTRAVENOUS

## 2016-08-19 MED ORDER — BUPIVACAINE HCL (PF) 0.25 % IJ SOLN
INTRAMUSCULAR | Status: DC | PRN
  Administered 2016-08-19: 10 mL

## 2016-08-19 MED ORDER — FENTANYL CITRATE (PF) 100 MCG/2ML IJ SOLN
INTRAMUSCULAR | Status: DC | PRN
  Administered 2016-08-19: 50 ug via INTRAVENOUS

## 2016-08-19 MED ORDER — CEFAZOLIN SODIUM-DEXTROSE 2-4 GM/100ML-% IV SOLN
2.0000 g | INTRAVENOUS | Status: AC
  Administered 2016-08-19: 2 g via INTRAVENOUS

## 2016-08-19 MED ORDER — PROPOFOL 10 MG/ML IV BOLUS
INTRAVENOUS | Status: AC
  Filled 2016-08-19: qty 20

## 2016-08-19 MED ORDER — MIDAZOLAM HCL 2 MG/2ML IJ SOLN
INTRAMUSCULAR | Status: AC
  Filled 2016-08-19: qty 2

## 2016-08-19 MED ORDER — LIDOCAINE 2% (20 MG/ML) 5 ML SYRINGE
INTRAMUSCULAR | Status: AC
  Filled 2016-08-19: qty 5

## 2016-08-19 MED ORDER — FENTANYL CITRATE (PF) 100 MCG/2ML IJ SOLN: 50.0000 ug | INTRAMUSCULAR | Status: DC | PRN

## 2016-08-19 MED ORDER — CHLORHEXIDINE GLUCONATE 4 % EX LIQD: 60.0000 mL | Freq: Once | CUTANEOUS | Status: DC

## 2016-08-19 MED ORDER — SCOPOLAMINE 1 MG/3DAYS TD PT72
1.0000 | MEDICATED_PATCH | Freq: Once | TRANSDERMAL | Status: DC | PRN
Start: 2016-08-19 — End: 2016-08-19

## 2016-08-19 MED ORDER — OXYCODONE HCL 5 MG PO TABS: 5.0000 mg | ORAL_TABLET | Freq: Once | ORAL | Status: DC | PRN

## 2016-08-19 MED ORDER — ONDANSETRON HCL 4 MG/2ML IJ SOLN
INTRAMUSCULAR | Status: DC | PRN
  Administered 2016-08-19: 4 mg via INTRAVENOUS

## 2016-08-19 MED ORDER — LIDOCAINE HCL (PF) 1 % IJ SOLN
INTRAMUSCULAR | Status: AC
  Filled 2016-08-19: qty 30

## 2016-08-19 MED ORDER — CEFAZOLIN SODIUM-DEXTROSE 2-4 GM/100ML-% IV SOLN
INTRAVENOUS | Status: AC
  Filled 2016-08-19: qty 100

## 2016-08-19 MED ORDER — LIDOCAINE HCL (PF) 1 % IJ SOLN
INTRAMUSCULAR | Status: DC | PRN
  Administered 2016-08-19: 10 mL

## 2016-08-19 MED ORDER — HYDROMORPHONE HCL 1 MG/ML IJ SOLN: 0.2500 mg | INTRAMUSCULAR | Status: DC | PRN

## 2016-08-19 MED ORDER — ONDANSETRON HCL 4 MG/2ML IJ SOLN
INTRAMUSCULAR | Status: AC
  Filled 2016-08-19: qty 2

## 2016-08-19 MED ORDER — LACTATED RINGERS IV SOLN
INTRAVENOUS | Status: DC
  Administered 2016-08-19: 07:00:00 via INTRAVENOUS

## 2016-08-19 MED ORDER — OXYCODONE HCL 5 MG/5ML PO SOLN: 5.0000 mg | Freq: Once | ORAL | Status: DC | PRN

## 2016-08-19 MED ORDER — MEPERIDINE HCL 25 MG/ML IJ SOLN: 6.2500 mg | INTRAMUSCULAR | Status: DC | PRN

## 2016-08-19 MED ORDER — FENTANYL CITRATE (PF) 100 MCG/2ML IJ SOLN
INTRAMUSCULAR | Status: AC
  Filled 2016-08-19: qty 2

## 2016-08-19 MED ORDER — BUPIVACAINE HCL (PF) 0.25 % IJ SOLN
INTRAMUSCULAR | Status: AC
  Filled 2016-08-19: qty 30

## 2016-08-19 MED ORDER — PROMETHAZINE HCL 25 MG/ML IJ SOLN: 6.2500 mg | INTRAMUSCULAR | Status: DC | PRN

## 2016-08-19 MED ORDER — LIDOCAINE HCL (PF) 1 % IJ SOLN
INTRAMUSCULAR | Status: AC
Start: 2016-08-19 — End: 2016-08-19
  Filled 2016-08-19: qty 5

## 2016-08-19 SURGICAL SUPPLY — 47 items
BANDAGE ACE 3X5.8 VEL STRL LF (GAUZE/BANDAGES/DRESSINGS) ×2 IMPLANT
BLADE CARPAL TUNNEL SNGL USE (BLADE) ×2 IMPLANT
BLADE SURG 15 STRL LF DISP TIS (BLADE) ×2 IMPLANT
BLADE SURG 15 STRL SS (BLADE) ×2
BNDG CONFORM 3 STRL LF (GAUZE/BANDAGES/DRESSINGS) ×2 IMPLANT
BRUSH SCRUB EZ PLAIN DRY (MISCELLANEOUS) ×2 IMPLANT
CORDS BIPOLAR (ELECTRODE) ×2 IMPLANT
COVER BACK TABLE 60X90IN (DRAPES) ×2 IMPLANT
CUFF TOURNIQUET SINGLE 18IN (TOURNIQUET CUFF) ×2 IMPLANT
DRAIN PENROSE 1/4X12 LTX STRL (WOUND CARE) IMPLANT
DRAPE EXTREMITY T 121X128X90 (DRAPE) ×2 IMPLANT
DRAPE SURG 17X23 STRL (DRAPES) ×2 IMPLANT
DRSG EMULSION OIL 3X3 NADH (GAUZE/BANDAGES/DRESSINGS) ×2 IMPLANT
GAUZE SPONGE 4X4 12PLY STRL (GAUZE/BANDAGES/DRESSINGS) IMPLANT
GAUZE SPONGE 4X4 16PLY XRAY LF (GAUZE/BANDAGES/DRESSINGS) IMPLANT
GAUZE XEROFORM 1X8 LF (GAUZE/BANDAGES/DRESSINGS) ×2 IMPLANT
GLOVE BIOGEL M STRL SZ7.5 (GLOVE) IMPLANT
GLOVE BIOGEL PI IND STRL 7.0 (GLOVE) ×2 IMPLANT
GLOVE BIOGEL PI INDICATOR 7.0 (GLOVE) ×2
GLOVE ECLIPSE 6.5 STRL STRAW (GLOVE) ×2 IMPLANT
GLOVE SS BIOGEL STRL SZ 8 (GLOVE) ×1 IMPLANT
GLOVE SUPERSENSE BIOGEL SZ 8 (GLOVE) ×1
GOWN STRL REUS W/ TWL LRG LVL3 (GOWN DISPOSABLE) ×1 IMPLANT
GOWN STRL REUS W/ TWL XL LVL3 (GOWN DISPOSABLE) ×1 IMPLANT
GOWN STRL REUS W/TWL LRG LVL3 (GOWN DISPOSABLE) ×1
GOWN STRL REUS W/TWL XL LVL3 (GOWN DISPOSABLE) ×1
LOOP VESSEL MAXI BLUE (MISCELLANEOUS) IMPLANT
NDL SAFETY ECLIPSE 18X1.5 (NEEDLE) ×1 IMPLANT
NEEDLE HYPO 18GX1.5 SHARP (NEEDLE) ×1
NEEDLE HYPO 22GX1.5 SAFETY (NEEDLE) IMPLANT
NEEDLE HYPO 25X1 1.5 SAFETY (NEEDLE) ×4 IMPLANT
NS IRRIG 1000ML POUR BTL (IV SOLUTION) ×2 IMPLANT
PACK BASIN DAY SURGERY FS (CUSTOM PROCEDURE TRAY) ×2 IMPLANT
PAD ALCOHOL SWAB (MISCELLANEOUS) ×16 IMPLANT
PAD CAST 3X4 CTTN HI CHSV (CAST SUPPLIES) ×2 IMPLANT
PADDING CAST ABS 4INX4YD NS (CAST SUPPLIES)
PADDING CAST ABS COTTON 4X4 ST (CAST SUPPLIES) IMPLANT
PADDING CAST COTTON 3X4 STRL (CAST SUPPLIES) ×2
SHEET MEDIUM DRAPE 40X70 STRL (DRAPES) ×2 IMPLANT
STOCKINETTE 4X48 STRL (DRAPES) ×2 IMPLANT
SUT PROLENE 4 0 PS 2 18 (SUTURE) ×2 IMPLANT
SYR BULB 3OZ (MISCELLANEOUS) ×2 IMPLANT
SYR CONTROL 10ML LL (SYRINGE) ×4 IMPLANT
TOWEL OR 17X24 6PK STRL BLUE (TOWEL DISPOSABLE) ×2 IMPLANT
TOWEL OR NON WOVEN STRL DISP B (DISPOSABLE) ×2 IMPLANT
TRAY DSU PREP LF (CUSTOM PROCEDURE TRAY) ×2 IMPLANT
UNDERPAD 30X30 (UNDERPADS AND DIAPERS) ×2 IMPLANT

## 2016-08-19 NOTE — Discharge Instructions (Signed)
°  Post Anesthesia Home Care Instructions  Activity: Get plenty of rest for the remainder of the day. A responsible adult should stay with you for 24 hours following the procedure.  For the next 24 hours, DO NOT: -Drive a car -Paediatric nurse -Drink alcoholic beverages -Take any medication unless instructed by your physician -Make any legal decisions or sign important papers.  Meals: Start with liquid foods such as gelatin or soup. Progress to regular foods as tolerated. Avoid greasy, spicy, heavy foods. If nausea and/or vomiting occur, drink only clear liquids until the nausea and/or vomiting subsides. Call your physician if vomiting continues.  Special Instructions/Symptoms: Your throat may feel dry or sore from the anesthesia or the breathing tube placed in your throat during surgery. If this causes discomfort, gargle with warm salt water. The discomfort should disappear within 24 hours.  If you had a scopolamine patch placed behind your ear for the management of post- operative nausea and/or vomiting:  1. The medication in the patch is effective for 72 hours, after which it should be removed.  Wrap patch in a tissue and discard in the trash. Wash hands thoroughly with soap and water. 2. You may remove the patch earlier than 72 hours if you experience unpleasant side effects which may include dry mouth, dizziness or visual disturbances. 3. Avoid touching the patch. Wash your hands with soap and water after contact with the patch.   Call your surgeon if you experience:   1.  Fever over 101.0. 2.  Inability to urinate. 3.  Nausea and/or vomiting. 4.  Extreme swelling or bruising at the surgical site. 5.  Continued bleeding from the incision. 6.  Increased pain, redness or drainage from the incision. 7.  Problems related to your pain medication. 8.  Any problems and/or concerns  Call your surgeon if you experience:   1.  Fever over 101.0. 2.  Inability to urinate. 3.  Nausea  and/or vomiting. 4.  Extreme swelling or bruising at the surgical site. 5.  Continued bleeding from the incision. 6.  Increased pain, redness or drainage from the incision. 7.  Problems related to your pain medication. 8.  Any problems and/or concerns    We recommend that you to take vitamin C 1000 mg a day to promote healing. We also rec Vit B6 200 mg a day to promote nerve health and recovery We also recommend that if you require  pain medicine that you take a stool softener to prevent constipation as most pain medicines will have constipation side effects. We recommend either Peri-Colace or Senokot and recommend that you also consider adding MiraLAX as well to prevent the constipation affects from pain medicine if you are required to use them. These medicines are over the counter and may be purchased at a local pharmacy. A cup of yogurt and a probiotic can also be helpful during the recovery process as the medicines can disrupt your intestinal environment. Keep bandage clean and dry.  Call for any problems.  No smoking.  Criteria for driving a car: you should be off your pain medicine for 7-8 hours, able to drive one handed(confident), thinking clearly and feeling able in your judgement to drive. Continue elevation as it will decrease swelling.  If instructed by MD move your fingers within the confines of the bandage/splint.  Use ice if instructed by your MD. Call immediately for any sudden loss of feeling in your hand/arm or change in functional abilities of the extremity.

## 2016-08-19 NOTE — Transfer of Care (Signed)
Immediate Anesthesia Transfer of Care Note  Patient: [name]  Procedure(s) Performed: Procedure(s): LEFT LIMITED OPEN CARPAL TUNNEL RELEASE (Left)  Patient Location: PACU  Anesthesia Type:General  Level of Consciousness: awake and patient cooperative  Airway & Oxygen Therapy: Patient Spontanous Breathing and Patient connected to face mask oxygen  Post-op Assessment: Report given to RN and Post -op Vital signs reviewed and stable  Post vital signs: Reviewed and stable  Last Vitals:  Vitals:   08/19/16 0649  BP: 136/81  Pulse: 88  Resp: 16  Temp: 36.8 C    Last Pain:  Vitals:   08/19/16 0649  TempSrc: Oral  PainSc:          Complications: No apparent anesthesia complications

## 2016-08-19 NOTE — Anesthesia Preprocedure Evaluation (Addendum)
Anesthesia Evaluation  Patient identified by MRN, date of birth, ID band Patient awake    Reviewed: Allergy & Precautions, NPO status , Patient's Chart, lab work & pertinent test results  Airway Mallampati: II  TM Distance: >3 FB Neck ROM: Full    Dental no notable dental hx.    Pulmonary neg pulmonary ROS,    Pulmonary exam normal breath sounds clear to auscultation       Cardiovascular + Valvular Problems/Murmurs  Rhythm:Regular Rate:Normal + Systolic murmurs    Neuro/Psych  Headaches, negative psych ROS   GI/Hepatic negative GI ROS, Neg liver ROS,   Endo/Other  negative endocrine ROS  Renal/GU negative Renal ROS  negative genitourinary   Musculoskeletal negative musculoskeletal ROS (+)   Abdominal   Peds negative pediatric ROS (+)  Hematology negative hematology ROS (+)   Anesthesia Other Findings   Reproductive/Obstetrics negative OB ROS                                                             Anesthesia Evaluation    Airway        Dental   Pulmonary           Cardiovascular      Neuro/Psych  Headaches,    GI/Hepatic   Endo/Other    Renal/GU      Musculoskeletal   Abdominal   Peds  Hematology   Anesthesia Other Findings   Reproductive/Obstetrics                             Anesthesia Physical Anesthesia Plan Anesthesia Quick Evaluation                                   Anesthesia Evaluation  Patient identified by MRN, date of birth, ID band Patient awake    Reviewed: Allergy & Precautions, NPO status , Patient's Chart, lab work & pertinent test results  History of Anesthesia Complications Negative for: history of anesthetic complications  Airway Mallampati: I  TM Distance: >3 FB Neck ROM: Full    Dental  (+) Dental Advisory Given   Pulmonary neg pulmonary ROS,    breath sounds clear to  auscultation       Cardiovascular negative cardio ROS   Rhythm:Regular Rate:Normal     Neuro/Psych negative neurological ROS     GI/Hepatic negative GI ROS, Neg liver ROS,   Endo/Other  negative endocrine ROS  Renal/GU negative Renal ROS     Musculoskeletal negative musculoskeletal ROS (+)   Abdominal   Peds  Hematology negative hematology ROS (+)   Anesthesia Other Findings   Reproductive/Obstetrics Pt is nursing                             Anesthesia Physical Anesthesia Plan  ASA: I  Anesthesia Plan: MAC   Post-op Pain Management:    Induction: Intravenous  Airway Management Planned: Natural Airway and Nasal Cannula  Additional Equipment:   Intra-op Plan:   Post-operative Plan:   Informed Consent: I have reviewed the patients History and Physical, chart, labs and discussed the procedure including the risks, benefits and alternatives  for the proposed anesthesia with the patient or authorized representative who has indicated his/her understanding and acceptance.   Dental advisory given  Plan Discussed with: CRNA and Surgeon  Anesthesia Plan Comments: (Plan routine monitors, MAC Pt understands to dispose of milk while taking pain meds)        Anesthesia Quick Evaluation  Anesthesia Physical Anesthesia Plan  ASA: I  Anesthesia Plan: MAC   Post-op Pain Management:    Induction: Intravenous  Airway Management Planned:   Additional Equipment:   Intra-op Plan:   Post-operative Plan:   Informed Consent: I have reviewed the patients History and Physical, chart, labs and discussed the procedure including the risks, benefits and alternatives for the proposed anesthesia with the patient or authorized representative who has indicated his/her understanding and acceptance.   Dental advisory given  Plan Discussed with: CRNA  Anesthesia Plan Comments:         Anesthesia Quick Evaluation

## 2016-08-19 NOTE — H&P (Signed)
[name] is an 36 y.o. female.   Chief Complaint: L CTS HPI: Presents for L CTR Patient presents for evaluation and treatment of the of their upper extremity predicament. The patient denies neck, back, chest or  abdominal pain. The patient notes that they have no lower extremity problems. The patients primary complaint is noted. We are planning surgical care pathway for the upper extremity.  Past Medical History:  Diagnosis Date  . Carpal tunnel syndrome on left 08/2016    Past Surgical History:  Procedure Laterality Date  . CARPAL TUNNEL RELEASE Right 04/05/2016   Procedure: RIGHT LIMITED OPEN CARPAL TUNNEL RELEASE;  Surgeon: Roseanne Kaufman, MD;  Location: [facility];  Service: Orthopedics;  Laterality: Right;  . DILATION AND EVACUATION  03/19/2012   Procedure: DILATATION AND EVACUATION;  Surgeon: Floyce Stakes. Pamala Hurry, MD;  Location: [facility];  Service: Gynecology;  Laterality: N/A;  With Ultrasound  . EXCISIONAL BIOSPY LEFT DEEP NECK MASS  10/15/2007   Benign salivary gland tissue w/ chronic inflammation  . WISDOM TOOTH EXTRACTION      Family History  Problem Relation Age of Onset  . Migraines Mother   . Hyperlipidemia Mother   . Arthritis Mother   . Heart murmur Mother   . Glaucoma Father   . Cancer Maternal Aunt 34    breast  . Heart attack Maternal Grandmother   . Heart disease Maternal Grandfather   . Hyperlipidemia Paternal Grandmother   . Heart attack Paternal Grandfather   . Other Neg Hx    Social History:  reports that she has never smoked. She has never used smokeless tobacco. She reports that she drinks alcohol. She reports that she does not use drugs.  Allergies: No Known Allergies  Medications Prior to Admission  Medication Sig Dispense Refill  . estradiol (ESTRACE) 1 MG tablet Take 1 mg by mouth daily.    Marland Kitchen estradiol (VIVELLE-DOT) 0.1 MG/24HR patch Place 1 patch onto the skin 2 (two) times a week.    . Prenatal Vit-Fe Fumarate-FA (PRENATAL  MULTIVITAMIN) TABS tablet Take 1 tablet by mouth at bedtime.       No results found for this or any previous visit (from the past 48 hour(s)). No results found.  ROS  Blood pressure 136/81, pulse 88, temperature 98.3 F (36.8 C), temperature source Oral, resp. rate 16, height 5\' 6"  (1.676 m), weight 71.4 kg (157 lb 6.4 oz), last menstrual period 08/15/2016, SpO2 99 %, not currently breastfeeding. Physical Exam L CTS positive tinels , MNCT and [place] LUE  RUE resolved Sx after CTR  The patient is alert and oriented in no acute distress. The patient complains of pain in the affected upper extremity.  The patient is noted to have a normal HEENT exam. Lung fields show equal chest expansion and no shortness of breath. Abdomen exam is nontender without distention. Lower extremity examination does not show any fracture dislocation or blood clot symptoms. Pelvis is stable and the neck and back are stable and nontender.  Assessment/Plan We are planning surgery for your upper extremity. The risk and benefits of surgery to include risk of bleeding, infection, anesthesia,  damage to normal structures and failure of the surgery to accomplish its intended goals of relieving symptoms and restoring function have been discussed in detail. With this in mind we plan to proceed. I have specifically discussed with the patient the pre-and postoperative regime and the dos and don'ts and risk and benefits in great detail. Risk and benefits  of surgery also include risk of dystrophy(CRPS), chronic nerve pain, failure of the healing process to go onto completion and other inherent risks of surgery The relavent the pathophysiology of the disease/injury process, as well as the alternatives for treatment and postoperative course of action has been discussed in great detail with the patient who desires to proceed.  We will do everything in our power to help you (the patient) restore function to the upper extremity. It  is a pleasure to see this patient today.   Paulene Floor, MD 08/19/2016, 7:54 AM

## 2016-08-19 NOTE — Op Note (Signed)
See [id number] SP L CTR  Lamara Brecht MD

## 2016-08-19 NOTE — Anesthesia Postprocedure Evaluation (Signed)
Anesthesia Post Note  Patient: [name]  Procedure(s) Performed: Procedure(s) (LRB): LEFT LIMITED OPEN CARPAL TUNNEL RELEASE (Left)  Patient location during evaluation: PACU Anesthesia Type: MAC Level of consciousness: awake and alert Pain management: pain level controlled Vital Signs Assessment: post-procedure vital signs reviewed and stable Respiratory status: spontaneous breathing Cardiovascular status: stable Anesthetic complications: no       Last Vitals:  Vitals:   08/19/16 0845 08/19/16 0855  BP: 120/83 116/85  Pulse: 69 71  Resp: 12 12  Temp:      Last Pain:  Vitals:   08/19/16 0855  TempSrc:   PainSc: 0-No pain                 Nolon Nations

## 2016-08-20 NOTE — Op Note (Deleted)
  The note originally documented on this encounter has been moved the the encounter in which it belongs.  

## 2016-08-20 NOTE — Op Note (Signed)
NAMEJENNY, [name]              ACCOUNT NO.:  [id number]  MEDICAL RECORD NO.:  [id number]  LOCATION:                               FACILITY:  [facility]  PHYSICIAN:  Satira Anis. Sofia Vanmeter, M.D.DATE OF BIRTH:  [birth year]  DATE OF PROCEDURE: DATE OF DISCHARGE:                              OPERATIVE REPORT   PREOPERATIVE DIAGNOSIS:  Carpal tunnel syndrome, left upper extremity.  POSTOPERATIVE DIAGNOSIS:  Carpal tunnel syndrome, left upper extremity.  PROCEDURE: 1. Left median nerve/peripheral nerve block at the wrist-forearm level     for anesthetic purposes for carpal tunnel release. 2. Left limited open carpal tunnel release.  SURGEON:  Satira Anis. Amedeo Plenty, M.D.  ASSISTANT:  None.  COMPLICATIONS:  None.  ANESTHESIA:  Local with IV sedation.  TOURNIQUET TIME:  Less than 10 minutes.  INDICATIONS:  A pleasant female, 36 years of age, who presents for left carpal tunnel release.  She understands risks and benefits and desires to proceed.  OPERATIVE PROCEDURE:  The patient was seen by myself and Anesthesia, taken to the operative theater, underwent smooth induction of Hibiclens pre-scrub followed by a median nerve/field block about the posterior incision.  Following this, she was prepped with Betadine scrub and paint.  Antibiotics were given.  Time-out was called and observed. Sterile field was secured.  Arm was elevated, tourniquet was insufflated to 250 mmHg.  Following this, an incision was made 1 cm in nature at the distal edge of the transverse carpal ligament.  Dissection was carried down and the transverse carpal ligament was identified and released.  Distal release was verified with fat pad egression, superficial palmar arch and median nerve carefully protected, and full release distally was verified with 4.5 loupe magnification.  I dissected in a distal to proximal dissection until adequate room was available for canal, prepared toward device 1, 2, and 3, followed by  placement of the security clip.  The patient was awake, alert, and oriented during the entire operation and during passage of the canal devices.  Following placement of security clip, the obturator disengaged indicating correct placement and the patient then underwent a very careful and cautious placement of the security knife and the security clip effectively releasing the proximal leaflet.  She tolerated this well.  There were no complicating features.  Following this, the patient then had the area inspected, it looked well. She was decompressed nicely.  Median nerve was intact, hyperemic and had findings of chronic compression.  She tolerated this well.  She was irrigated.  The wound closed after hemostasis was secured with bipolar electrocautery.  This was an uncomplicated carpal tunnel release.  We will monitor in the recovery room.  Discharge her after a period of observatory care.  All sponge, needle, and instrument counts reported as correct.  She will see [place] back in the office in a week and therapy in 12 days. Standard algorithm will be adhered to.     Satira Anis. Amedeo Plenty, M.D.   ______________________________ Satira Anis. Amedeo Plenty, M.D.    [facility]  D:  08/19/2016  T:  08/20/2016  Job:  [id number]

## 2016-08-23 ENCOUNTER — Encounter (HOSPITAL_BASED_OUTPATIENT_CLINIC_OR_DEPARTMENT_OTHER): Payer: Self-pay | Admitting: Orthopedic Surgery

## 2016-08-26 DIAGNOSIS — G5602 Carpal tunnel syndrome, left upper limb: Secondary | ICD-10-CM | POA: Diagnosis not present

## 2016-08-26 DIAGNOSIS — Z4789 Encounter for other orthopedic aftercare: Secondary | ICD-10-CM | POA: Diagnosis not present

## 2016-08-29 DIAGNOSIS — Z3141 Encounter for fertility testing: Secondary | ICD-10-CM | POA: Diagnosis not present

## 2016-08-30 MED FILL — LEUPROLIDE 2WK 1 MG/0.2 ML: 1 | 14 days supply | Qty: 1 | Fill #0

## 2016-09-02 DIAGNOSIS — Z3141 Encounter for fertility testing: Secondary | ICD-10-CM | POA: Diagnosis not present

## 2016-09-02 DIAGNOSIS — G5602 Carpal tunnel syndrome, left upper limb: Secondary | ICD-10-CM | POA: Diagnosis not present

## 2016-09-07 DIAGNOSIS — Z3141 Encounter for fertility testing: Secondary | ICD-10-CM | POA: Diagnosis not present

## 2016-09-13 MED FILL — ESTRADIOL 2 MG TABLET: 2 | 30 days supply | Qty: 60 | Fill #1

## 2016-09-13 MED FILL — LEUPROLIDE 2WK 1 MG/0.2 ML: 1 | 14 days supply | Qty: 1 | Fill #1

## 2016-09-16 ENCOUNTER — Other Ambulatory Visit: Payer: Self-pay | Admitting: Family Medicine

## 2016-09-16 MED ORDER — OSELTAMIVIR PHOSPHATE 75 MG PO CAPS: 75.0000 mg | ORAL_CAPSULE | Freq: Every day | ORAL | 0 refills | Status: DC

## 2016-09-16 NOTE — Telephone Encounter (Signed)
Patient's daughter was diagnosed with the flu today. Patient wants to know if Tamiflu can be called in for her and her husband,Ryan Kloth.  Patient uses [facility]

## 2016-09-16 NOTE — Telephone Encounter (Addendum)
I do not see that Mr. Storie is a patient here but is at the [facility] office.

## 2016-09-16 NOTE — Telephone Encounter (Signed)
Left message on vm for pt 

## 2016-09-16 NOTE — Telephone Encounter (Signed)
I will call in tamiflu for both. Let pt know.

## 2016-09-23 DIAGNOSIS — Z3141 Encounter for fertility testing: Secondary | ICD-10-CM | POA: Diagnosis not present

## 2016-09-23 DIAGNOSIS — N979 Female infertility, unspecified: Secondary | ICD-10-CM | POA: Diagnosis not present

## 2016-09-30 DIAGNOSIS — Z3183 Encounter for assisted reproductive fertility procedure cycle: Secondary | ICD-10-CM | POA: Diagnosis not present

## 2016-10-07 MED FILL — TARON-C DHA CAPSULE: 53.5-38-1 | 90 days supply | Qty: 90 | Fill #1

## 2016-10-10 DIAGNOSIS — Z3200 Encounter for pregnancy test, result unknown: Secondary | ICD-10-CM | POA: Diagnosis not present

## 2016-10-20 DIAGNOSIS — G5602 Carpal tunnel syndrome, left upper limb: Secondary | ICD-10-CM | POA: Diagnosis not present

## 2016-10-20 DIAGNOSIS — Z4789 Encounter for other orthopedic aftercare: Secondary | ICD-10-CM | POA: Diagnosis not present

## 2017-02-07 ENCOUNTER — Telehealth: Payer: 59 | Admitting: Family

## 2017-02-07 DIAGNOSIS — M542 Cervicalgia: Secondary | ICD-10-CM | POA: Diagnosis not present

## 2017-02-07 MED ORDER — CYCLOBENZAPRINE HCL 10 MG PO TABS: 10.0000 mg | ORAL_TABLET | Freq: Three times a day (TID) | ORAL | 0 refills | Status: DC | PRN

## 2017-02-07 MED ORDER — NAPROXEN 500 MG PO TABS: 500.0000 mg | ORAL_TABLET | Freq: Two times a day (BID) | ORAL | 0 refills | Status: DC

## 2017-02-07 MED FILL — CYCLOBENZAPRINE 10 MG TAB: 10 | 10 days supply | Qty: 30 | Fill #0

## 2017-02-07 MED FILL — NAPROXEN 500 MG TABLET: 500 | 15 days supply | Qty: 30 | Fill #0

## 2017-02-07 NOTE — Progress Notes (Signed)

## 2017-04-05 DIAGNOSIS — Z3200 Encounter for pregnancy test, result unknown: Secondary | ICD-10-CM | POA: Diagnosis not present

## 2017-04-07 DIAGNOSIS — Z3200 Encounter for pregnancy test, result unknown: Secondary | ICD-10-CM | POA: Diagnosis not present

## 2017-04-11 DIAGNOSIS — Z3189 Encounter for other procreative management: Secondary | ICD-10-CM | POA: Diagnosis not present

## 2017-05-31 DIAGNOSIS — Z3141 Encounter for fertility testing: Secondary | ICD-10-CM | POA: Diagnosis not present

## 2017-05-31 DIAGNOSIS — Z3169 Encounter for other general counseling and advice on procreation: Secondary | ICD-10-CM | POA: Diagnosis not present

## 2017-07-03 DIAGNOSIS — H5213 Myopia, bilateral: Secondary | ICD-10-CM | POA: Diagnosis not present

## 2017-07-18 ENCOUNTER — Telehealth: Payer: 59 | Admitting: Family

## 2017-07-18 DIAGNOSIS — B852 Pediculosis, unspecified: Secondary | ICD-10-CM | POA: Diagnosis not present

## 2017-07-18 MED ORDER — IVERMECTIN 0.5 % EX LOTN: 1.0000 | TOPICAL_LOTION | Freq: Once | CUTANEOUS | 0 refills | Status: AC

## 2017-07-18 MED FILL — SKLICE 0.5% LOTION: 0.5 | 1 days supply | Qty: 117 | Fill #0

## 2017-07-18 NOTE — Progress Notes (Signed)
E Visit for Rash  We are sorry that you are not feeling well. Here is how we plan to help!  I have sent in a prescription for Lice   HOME CARE:   Take cool showers and avoid direct sunlight.  Apply cool compress or wet dressings.  Take a bath in an oatmeal bath.  Sprinkle content of one Aveeno packet under running faucet with comfortably warm water.  Bathe for 15-20 minutes, 1-2 times daily.  Pat dry with a towel. Do not rub the rash.  Use hydrocortisone cream.  Take an antihistamine like Benadryl for widespread rashes that itch.  The adult dose of Benadryl is 25-50 mg by mouth 4 times daily.  Caution:  This type of medication may cause sleepiness.  Do not drink alcohol, drive, or operate dangerous machinery while taking antihistamines.  Do not take these medications if you have prostate enlargement.  Read package instructions thoroughly on all medications that you take.  GET HELP RIGHT AWAY IF:   Symptoms don't go away after treatment.  Severe itching that persists.  If you rash spreads or swells.  If you rash begins to smell.  If it blisters and opens or develops a yellow-brown crust.  You develop a fever.  You have a sore throat.  You become short of breath.  MAKE SURE YOU:  Understand these instructions. Will watch your condition. Will get help right away if you are not doing well or get worse.  Thank you for choosing an e-visit. Your e-visit answers were reviewed by a board certified advanced clinical practitioner to complete your personal care plan. Depending upon the condition, your plan could have included both over the counter or prescription medications. Please review your pharmacy choice. Be sure that the pharmacy you have chosen is open so that you can pick up your prescription now.  If there is a problem you may message your provider in [place] to have the prescription routed to another pharmacy. Your safety is important to [place]. If you have drug allergies  check your prescription carefully.  For the next 24 hours, you can use MyChart to ask questions about today's visit, request a non-urgent call back, or ask for a work or school excuse from your e-visit provider. You will get an email in the next two days asking about your experience. I hope that your e-visit has been valuable and will speed your recovery.

## 2017-07-21 DIAGNOSIS — Z01419 Encounter for gynecological examination (general) (routine) without abnormal findings: Secondary | ICD-10-CM | POA: Diagnosis not present

## 2017-07-21 DIAGNOSIS — Z6826 Body mass index (BMI) 26.0-26.9, adult: Secondary | ICD-10-CM | POA: Diagnosis not present

## 2017-07-21 DIAGNOSIS — Z1151 Encounter for screening for human papillomavirus (HPV): Secondary | ICD-10-CM | POA: Diagnosis not present

## 2017-07-21 DIAGNOSIS — N920 Excessive and frequent menstruation with regular cycle: Secondary | ICD-10-CM | POA: Diagnosis not present

## 2017-07-21 LAB — HM PAP SMEAR: HM PAP: NEGATIVE

## 2017-07-21 MED FILL — TARON-C DHA CAPSULE: 53.5-38-1 | 90 days supply | Qty: 90 | Fill #0

## 2017-08-07 DIAGNOSIS — N920 Excessive and frequent menstruation with regular cycle: Secondary | ICD-10-CM | POA: Diagnosis not present

## 2017-11-20 DIAGNOSIS — Z319 Encounter for procreative management, unspecified: Secondary | ICD-10-CM | POA: Diagnosis not present

## 2017-11-20 DIAGNOSIS — E288 Other ovarian dysfunction: Secondary | ICD-10-CM | POA: Diagnosis not present

## 2017-12-12 DIAGNOSIS — Z113 Encounter for screening for infections with a predominantly sexual mode of transmission: Secondary | ICD-10-CM | POA: Diagnosis not present

## 2017-12-12 DIAGNOSIS — N858 Other specified noninflammatory disorders of uterus: Secondary | ICD-10-CM | POA: Diagnosis not present

## 2017-12-12 DIAGNOSIS — Z3141 Encounter for fertility testing: Secondary | ICD-10-CM | POA: Diagnosis not present

## 2017-12-13 MED FILL — METHYLPREDNISOLONE 16 MG TA: 16 | 4 days supply | Qty: 4 | Fill #0

## 2017-12-13 MED FILL — ESTRADIOL 0.1 MG PATCH: 0.1 | 28 days supply | Qty: 8 | Fill #0

## 2017-12-19 MED FILL — metroNIDAZOLE 500 MG TABS: 500 | 10 days supply | Qty: 20 | Fill #0

## 2017-12-19 MED FILL — CIPROFLOXACIN HCL 500 MG TA: 500 | 10 days supply | Qty: 20 | Fill #0

## 2017-12-22 MED FILL — DOXYCYCLINE HYCLATE 100 MG: 100 | 20 days supply | Qty: 40 | Fill #0

## 2017-12-22 MED FILL — CETROTIDE 0.25 MG KIT: 0.25 | 5 days supply | Qty: 5 | Fill #0

## 2017-12-22 MED FILL — VIRT-C DHA SOFTGEL: 53.5-38-1 | 90 days supply | Qty: 90 | Fill #1

## 2017-12-22 MED FILL — BD 3 ML SYRINGE 18GX1-1/2: 18G X 1-1/2 | 60 days supply | Qty: 60 | Fill #0

## 2017-12-22 MED FILL — NOVAREL 5000 UNIT SOLR: 5000 | 1 days supply | Qty: 2 | Fill #0

## 2017-12-22 MED FILL — BD NEEDLES 22GX1.5: 22G X 1-1/2 | 30 days supply | Qty: 30 | Fill #0

## 2017-12-22 MED FILL — BD 3 ML SYRINGE 18GX1-1/2": 18G X 1-1/2 | 60 days supply | Qty: 60 | Fill #0

## 2017-12-22 MED FILL — ESTRADIOL 2 MG TABLET: 2 | 30 days supply | Qty: 60 | Fill #0

## 2017-12-22 MED FILL — BD NEEDLES 30GX0.5: 30G X 1/2" | 30 days supply | Qty: 30 | Fill #0

## 2017-12-22 MED FILL — BD NEEDLES 22GX1.5": 22G X 1-1/2 | 30 days supply | Qty: 30 | Fill #0

## 2017-12-22 MED FILL — GONAL-F 1,050 UNITS VIAL: 1050 | 4 days supply | Qty: 4 | Fill #0

## 2017-12-22 MED FILL — MENOPUR 75 UNIT VIAL: 75 | 10 days supply | Qty: 10 | Fill #0

## 2017-12-22 MED FILL — BD NEEDLES 30GX0.5": 30G X 1/2" | 30 days supply | Qty: 30 | Fill #0

## 2017-12-22 MED FILL — PROGESTERONE OIL 50 MG/ML V: 50 | 30 days supply | Qty: 30 | Fill #0

## 2017-12-26 DIAGNOSIS — N979 Female infertility, unspecified: Secondary | ICD-10-CM | POA: Diagnosis not present

## 2017-12-26 DIAGNOSIS — Z3183 Encounter for assisted reproductive fertility procedure cycle: Secondary | ICD-10-CM | POA: Diagnosis not present

## 2017-12-29 DIAGNOSIS — N979 Female infertility, unspecified: Secondary | ICD-10-CM | POA: Diagnosis not present

## 2017-12-29 DIAGNOSIS — Z31430 Encounter of female for testing for genetic disease carrier status for procreative management: Secondary | ICD-10-CM | POA: Diagnosis not present

## 2017-12-29 DIAGNOSIS — Z3183 Encounter for assisted reproductive fertility procedure cycle: Secondary | ICD-10-CM | POA: Diagnosis not present

## 2018-01-03 DIAGNOSIS — Z31430 Encounter of female for testing for genetic disease carrier status for procreative management: Secondary | ICD-10-CM | POA: Diagnosis not present

## 2018-01-03 DIAGNOSIS — N979 Female infertility, unspecified: Secondary | ICD-10-CM | POA: Diagnosis not present

## 2018-01-03 DIAGNOSIS — Z3183 Encounter for assisted reproductive fertility procedure cycle: Secondary | ICD-10-CM | POA: Diagnosis not present

## 2018-01-06 DIAGNOSIS — Z3183 Encounter for assisted reproductive fertility procedure cycle: Secondary | ICD-10-CM | POA: Diagnosis not present

## 2018-01-06 DIAGNOSIS — N979 Female infertility, unspecified: Secondary | ICD-10-CM | POA: Diagnosis not present

## 2018-01-08 DIAGNOSIS — Z3183 Encounter for assisted reproductive fertility procedure cycle: Secondary | ICD-10-CM | POA: Diagnosis not present

## 2018-01-08 DIAGNOSIS — N979 Female infertility, unspecified: Secondary | ICD-10-CM | POA: Diagnosis not present

## 2018-01-08 DIAGNOSIS — Z31430 Encounter of female for testing for genetic disease carrier status for procreative management: Secondary | ICD-10-CM | POA: Diagnosis not present

## 2018-01-09 DIAGNOSIS — Z31430 Encounter of female for testing for genetic disease carrier status for procreative management: Secondary | ICD-10-CM | POA: Diagnosis not present

## 2018-01-09 DIAGNOSIS — N979 Female infertility, unspecified: Secondary | ICD-10-CM | POA: Diagnosis not present

## 2018-01-09 DIAGNOSIS — Z3183 Encounter for assisted reproductive fertility procedure cycle: Secondary | ICD-10-CM | POA: Diagnosis not present

## 2018-01-09 MED FILL — PROMETHAZINE 12.5 MG TABLET: 12.5 | 3 days supply | Qty: 10 | Fill #0

## 2018-01-09 MED FILL — OXYCODONE-ACETAMINOPHEN 5-3: 5-325 | 3 days supply | Qty: 10 | Fill #0

## 2018-01-10 MED FILL — PROGESTERONE MICRONIZED 200: 200 | 30 days supply | Qty: 60 | Fill #0

## 2018-03-08 DIAGNOSIS — Z319 Encounter for procreative management, unspecified: Secondary | ICD-10-CM | POA: Diagnosis not present

## 2018-03-20 MED FILL — GONAL-F 1,050 UNITS VIAL: 1050 | 4 days supply | Qty: 4 | Fill #0

## 2018-03-20 MED FILL — BD 3 ML SYRINGE 18GX1-1/2": 18G X 1-1/2 | 30 days supply | Qty: 60 | Fill #0

## 2018-03-20 MED FILL — BD NEEDLES 30GX0.5: 30G X 1/2" | 30 days supply | Qty: 30 | Fill #0

## 2018-03-20 MED FILL — GANIRELIX ACET 250 MCG/0.5: 250 | 7 days supply | Qty: 4 | Fill #0

## 2018-03-20 MED FILL — BD NEEDLES 30GX0.5": 30G X 1/2" | 30 days supply | Qty: 30 | Fill #0

## 2018-03-20 MED FILL — NOVAREL 5000 UNIT SOLR: 5000 | 1 days supply | Qty: 2 | Fill #0

## 2018-03-20 MED FILL — BD 3 ML SYRINGE 18GX1-1/2: 18G X 1-1/2 | 30 days supply | Qty: 60 | Fill #0

## 2018-03-20 MED FILL — ESTRADIOL 0.1 MG PATCH: 0.1 | 28 days supply | Qty: 8 | Fill #0

## 2018-03-20 MED FILL — DOXYCYCLINE HYCLATE 100 MG: 100 | 20 days supply | Qty: 40 | Fill #0

## 2018-03-20 MED FILL — MENOPUR 75 UNIT VIAL: 75 | 20 days supply | Qty: 20 | Fill #0

## 2018-03-28 DIAGNOSIS — N979 Female infertility, unspecified: Secondary | ICD-10-CM | POA: Diagnosis not present

## 2018-03-28 DIAGNOSIS — Z3183 Encounter for assisted reproductive fertility procedure cycle: Secondary | ICD-10-CM | POA: Diagnosis not present

## 2018-03-29 DIAGNOSIS — N83299 Other ovarian cyst, unspecified side: Secondary | ICD-10-CM | POA: Diagnosis not present

## 2018-04-10 DIAGNOSIS — Z319 Encounter for procreative management, unspecified: Secondary | ICD-10-CM | POA: Diagnosis not present

## 2018-04-20 DIAGNOSIS — N83299 Other ovarian cyst, unspecified side: Secondary | ICD-10-CM | POA: Diagnosis not present

## 2018-04-20 DIAGNOSIS — Z3183 Encounter for assisted reproductive fertility procedure cycle: Secondary | ICD-10-CM | POA: Diagnosis not present

## 2018-04-20 DIAGNOSIS — N979 Female infertility, unspecified: Secondary | ICD-10-CM | POA: Diagnosis not present

## 2018-04-20 DIAGNOSIS — Z31430 Encounter of female for testing for genetic disease carrier status for procreative management: Secondary | ICD-10-CM | POA: Diagnosis not present

## 2018-04-30 DIAGNOSIS — Z319 Encounter for procreative management, unspecified: Secondary | ICD-10-CM | POA: Diagnosis not present

## 2018-04-30 DIAGNOSIS — N83299 Other ovarian cyst, unspecified side: Secondary | ICD-10-CM | POA: Diagnosis not present

## 2018-05-12 DIAGNOSIS — Z3183 Encounter for assisted reproductive fertility procedure cycle: Secondary | ICD-10-CM | POA: Diagnosis not present

## 2018-05-12 DIAGNOSIS — Z31430 Encounter of female for testing for genetic disease carrier status for procreative management: Secondary | ICD-10-CM | POA: Diagnosis not present

## 2018-05-12 DIAGNOSIS — N979 Female infertility, unspecified: Secondary | ICD-10-CM | POA: Diagnosis not present

## 2018-05-14 MED FILL — DOXYCYCLINE HYC 100 MG CAPS: 100 | 20 days supply | Qty: 40 | Fill #0

## 2018-05-15 DIAGNOSIS — Z3183 Encounter for assisted reproductive fertility procedure cycle: Secondary | ICD-10-CM | POA: Diagnosis not present

## 2018-05-15 DIAGNOSIS — N979 Female infertility, unspecified: Secondary | ICD-10-CM | POA: Diagnosis not present

## 2018-05-15 DIAGNOSIS — Z31430 Encounter of female for testing for genetic disease carrier status for procreative management: Secondary | ICD-10-CM | POA: Diagnosis not present

## 2018-05-17 DIAGNOSIS — Z3183 Encounter for assisted reproductive fertility procedure cycle: Secondary | ICD-10-CM | POA: Diagnosis not present

## 2018-05-17 DIAGNOSIS — N979 Female infertility, unspecified: Secondary | ICD-10-CM | POA: Diagnosis not present

## 2018-05-19 DIAGNOSIS — N979 Female infertility, unspecified: Secondary | ICD-10-CM | POA: Diagnosis not present

## 2018-05-19 DIAGNOSIS — Z3183 Encounter for assisted reproductive fertility procedure cycle: Secondary | ICD-10-CM | POA: Diagnosis not present

## 2018-05-19 DIAGNOSIS — Z31430 Encounter of female for testing for genetic disease carrier status for procreative management: Secondary | ICD-10-CM | POA: Diagnosis not present

## 2018-05-21 DIAGNOSIS — Z31430 Encounter of female for testing for genetic disease carrier status for procreative management: Secondary | ICD-10-CM | POA: Diagnosis not present

## 2018-05-21 DIAGNOSIS — N979 Female infertility, unspecified: Secondary | ICD-10-CM | POA: Diagnosis not present

## 2018-05-21 DIAGNOSIS — Z3183 Encounter for assisted reproductive fertility procedure cycle: Secondary | ICD-10-CM | POA: Diagnosis not present

## 2018-05-23 DIAGNOSIS — Z3141 Encounter for fertility testing: Secondary | ICD-10-CM | POA: Diagnosis not present

## 2018-05-23 DIAGNOSIS — Z3183 Encounter for assisted reproductive fertility procedure cycle: Secondary | ICD-10-CM | POA: Diagnosis not present

## 2018-05-28 DIAGNOSIS — Z3183 Encounter for assisted reproductive fertility procedure cycle: Secondary | ICD-10-CM | POA: Diagnosis not present

## 2018-05-29 DIAGNOSIS — Z3183 Encounter for assisted reproductive fertility procedure cycle: Secondary | ICD-10-CM | POA: Diagnosis not present

## 2018-06-08 MED FILL — ESTRADIOL 0.1 MG PATCH: 0.1 | 28 days supply | Qty: 8 | Fill #1

## 2018-06-12 DIAGNOSIS — Z31430 Encounter of female for testing for genetic disease carrier status for procreative management: Secondary | ICD-10-CM | POA: Diagnosis not present

## 2018-06-12 DIAGNOSIS — N979 Female infertility, unspecified: Secondary | ICD-10-CM | POA: Diagnosis not present

## 2018-06-12 DIAGNOSIS — Z3183 Encounter for assisted reproductive fertility procedure cycle: Secondary | ICD-10-CM | POA: Diagnosis not present

## 2018-06-12 MED FILL — METHYLPREDNISOLONE 4 MG TAB: 4 | 4 days supply | Qty: 16 | Fill #0

## 2018-06-19 ENCOUNTER — Telehealth: Payer: Self-pay

## 2018-06-19 DIAGNOSIS — N8500 Endometrial hyperplasia, unspecified: Secondary | ICD-10-CM | POA: Diagnosis not present

## 2018-06-19 DIAGNOSIS — Z3141 Encounter for fertility testing: Secondary | ICD-10-CM | POA: Diagnosis not present

## 2018-06-19 NOTE — Telephone Encounter (Signed)
No concerns or inappropriate behavior. Pleasant patient.  Okay to transfer from my viewpoint.

## 2018-06-19 NOTE — Telephone Encounter (Signed)
Copied from [place] [id number]. Topic: Appointment Scheduling - Transfer of Care >> Jun 19, 2018 10:07 AM Alfredia Ferguson R wrote: Pt is requesting to transfer FROM: Amy Bedsole Pt is requesting to transfer TO: Grier Mitts Reason for requested transfer: Moved to a different area

## 2018-06-20 ENCOUNTER — Other Ambulatory Visit: Payer: Self-pay | Admitting: *Deleted

## 2018-06-20 ENCOUNTER — Encounter: Payer: Self-pay | Admitting: *Deleted

## 2018-06-21 ENCOUNTER — Other Ambulatory Visit: Payer: Self-pay | Admitting: Family Medicine

## 2018-06-21 ENCOUNTER — Encounter: Payer: Self-pay | Admitting: Family Medicine

## 2018-06-21 ENCOUNTER — Ambulatory Visit (HOSPITAL_COMMUNITY)
Admission: RE | Admit: 2018-06-21 | Discharge: 2018-06-21 | Disposition: A | Discharge status: Home / Self Care | Payer: 59 | Source: Ambulatory Visit | Attending: Family Medicine | Admitting: Family Medicine

## 2018-06-21 ENCOUNTER — Ambulatory Visit: Payer: 59 | Admitting: Family Medicine

## 2018-06-21 VITALS — BP 102/70 | HR 65 | Temp 98.3°F | Ht 65.5 in | Wt 167.0 lb

## 2018-06-21 DIAGNOSIS — R911 Solitary pulmonary nodule: Secondary | ICD-10-CM | POA: Diagnosis not present

## 2018-06-21 DIAGNOSIS — I4510 Unspecified right bundle-branch block: Secondary | ICD-10-CM

## 2018-06-21 DIAGNOSIS — R079 Chest pain, unspecified: Secondary | ICD-10-CM

## 2018-06-21 DIAGNOSIS — R7989 Other specified abnormal findings of blood chemistry: Secondary | ICD-10-CM

## 2018-06-21 LAB — D-DIMER, QUANTITATIVE (NOT AT ARMC): D DIMER QUANT: 0.84 ug{FEU}/mL — AB (ref <0.50)

## 2018-06-21 MED ORDER — SODIUM CHLORIDE (PF) 0.9 % IJ SOLN
INTRAMUSCULAR | Status: AC
  Filled 2018-06-21: qty 50

## 2018-06-21 MED ORDER — OMEPRAZOLE 40 MG PO CPDR: 40.0000 mg | DELAYED_RELEASE_CAPSULE | Freq: Every day | ORAL | 0 refills | Status: DC

## 2018-06-21 MED ORDER — IOPAMIDOL (ISOVUE-370) INJECTION 76%
INTRAVENOUS | Status: AC
  Filled 2018-06-21: qty 100

## 2018-06-21 MED ORDER — IOPAMIDOL (ISOVUE-370) INJECTION 76%
100.0000 mL | Freq: Once | INTRAVENOUS | Status: AC | PRN
  Administered 2018-06-21: 100 mL via INTRAVENOUS

## 2018-06-21 MED FILL — OMEPRAZOLE 40 MG CPDR: 40 | 30 days supply | Qty: 30 | Fill #0

## 2018-06-21 NOTE — Progress Notes (Signed)
Subjective:    Patient ID: [name], female    DOB: [birth year], 37 y.o.   MRN: [id number]  HPI    37 year old female presents for chest pain.    New onset pain in right chest wall, burning. Ongoing x 1 week.  Last 10-15 sec at a time.  7-8 times an hour.  No change with movement or eating.  Occ trigger with leaning forward.  Occ at night, not waking her up at night.   No SOB, no dizziness, no heart racing.   No chest pain in past.   She has noted breast tenderness... Started after started progesterone.  She has been on estrogen for invitro fertilization. Complete course 2 days ago.   She tried 40 mg omeprazole.Marland Kitchen No change.,   Family hx... No early MI, grandparents late in life CAD, CHF.  Social History /Family History/Past Medical History reviewed in detail and updated in EMR if needed. Blood pressure 102/70, pulse 65, temperature 98.3 F (36.8 C), temperature source Oral, height 5' 5.5" (1.664 m), weight 167 lb (75.8 kg), last menstrual period 06/03/2018.   Review of Systems  Constitutional: Negative for fatigue and fever.  HENT: Negative for congestion.   Eyes: Negative for pain.  Respiratory: Negative for cough and shortness of breath.   Cardiovascular: Positive for chest pain. Negative for palpitations and leg swelling.  Gastrointestinal: Negative for abdominal pain.  Genitourinary: Negative for dysuria and vaginal bleeding.  Musculoskeletal: Negative for back pain.  Neurological: Negative for syncope, light-headedness and headaches.  Psychiatric/Behavioral: Negative for dysphoric mood.       Objective:   Physical Exam  Constitutional: Vital signs are normal. She appears well-developed and well-nourished. She is cooperative.  Non-toxic appearance. She does not appear ill. No distress.  HENT:  Head: Normocephalic.  Right Ear: Hearing, tympanic membrane, external ear and ear canal normal. Tympanic membrane is not erythematous, not retracted and not  bulging.  Left Ear: Hearing, tympanic membrane, external ear and ear canal normal. Tympanic membrane is not erythematous, not retracted and not bulging.  Nose: No mucosal edema or rhinorrhea. Right sinus exhibits no maxillary sinus tenderness and no frontal sinus tenderness. Left sinus exhibits no maxillary sinus tenderness and no frontal sinus tenderness.  Mouth/Throat: Uvula is midline, oropharynx is clear and moist and mucous membranes are normal.  Eyes: Pupils are equal, round, and reactive to light. Conjunctivae, EOM and lids are normal. Lids are everted and swept, no foreign bodies found.  Neck: Trachea normal and normal range of motion. Neck supple. Carotid bruit is not present. No thyroid mass and no thyromegaly present.  Cardiovascular: Normal rate, regular rhythm, S1 normal, S2 normal, normal heart sounds, intact distal pulses and normal pulses. Exam reveals no gallop and no friction rub.  No murmur heard. Pulmonary/Chest: Effort normal and breath sounds normal. No tachypnea. No respiratory distress. She has no decreased breath sounds. She has no wheezes. She has no rhonchi. She has no rales.  No chest wall tenderness to palpatation  Abdominal: Soft. Normal appearance and bowel sounds are normal. There is no tenderness.  Neurological: She is alert.  Skin: Skin is warm, dry and intact. No rash noted.  Psychiatric: Her speech is normal and behavior is normal. Judgment and thought content normal. Her mood appears not anxious. Cognition and memory are normal. She does not exhibit a depressed mood.    EKG:  No previous for comparison. Possible RBBB.   Marland Kitchen    Assessment & Plan:

## 2018-06-21 NOTE — Patient Instructions (Addendum)
Please stop at the lab to have labs drawn.   Please stop at the front desk to set up referral.  Start omeprazole 40 mg daily x 2-4 weeks.  If severe chest pain or shortness of breath.. Got to ER.

## 2018-06-21 NOTE — Addendum Note (Signed)
Addended by: Eliezer Lofts E on: 06/21/2018 03:18 PM   Modules accepted: Orders

## 2018-06-26 ENCOUNTER — Encounter

## 2018-06-26 ENCOUNTER — Ambulatory Visit: Payer: 59 | Admitting: Cardiology

## 2018-06-26 ENCOUNTER — Encounter: Payer: Self-pay | Admitting: Cardiology

## 2018-06-26 VITALS — BP 112/78 | HR 76 | Ht 65.5 in | Wt 166.8 lb

## 2018-06-26 DIAGNOSIS — R079 Chest pain, unspecified: Secondary | ICD-10-CM | POA: Diagnosis not present

## 2018-06-26 DIAGNOSIS — I4510 Unspecified right bundle-branch block: Secondary | ICD-10-CM | POA: Insufficient documentation

## 2018-06-26 DIAGNOSIS — I517 Cardiomegaly: Secondary | ICD-10-CM | POA: Diagnosis not present

## 2018-06-26 NOTE — Progress Notes (Signed)
Cardiology Office Note:    Date:  06/26/2018   ID:  [name], DOB 1981-03-24, MRN [id number]  PCP:  Jinny Sanders, MD  Cardiologist:  Buford Dresser, MD PhD  Referring MD: Jinny Sanders, MD   Chief Complaint  Patient presents with  . Chest Pain    History of Present Illness:    [name] is a 37 y.o. female with a hx of bilateral tunnel syndrome who is seen as a new consult at the request of Bedsole, Amy E, MD for the evaluation and management of chest pain.  Several weeks ago, she noted right chest pain. It felt tight/burning, lasts seconds at a time but occurs multiple times/day. Not related to exertion, eating. Sometimes worse when she bends over to pick things up. No recent fevers or illnesses. Not related to deep breathing or cough. No shortness of breath, PND, orthopnea, LE edema. No syncope or palpitations.   Saw her PCP 1-2 weeks after the pain started. D-dimer was elevated, CT PE did not show PE but did note borderline cardiomegaly. ECG was sinus rhythm with incomplete RBBB.  Pain is somewhat better but not completely resolved. Not tender to palpation.  Cardiac history: Murmur around age 71; echo was normal.   FH: grandparents had heart disease late in life. Mat Gma with HF, afib, CVA. Gena Fray had massive MI at 19-80 and then heart failure. Mother has arrhythmia (not afib), HLD. Father is healthy.   SH: married, three kids. Undergoing IVF preparation to have 4th child. Works in Producer, television/film/video at [employer].  Past Medical History:  Diagnosis Date  . Carpal tunnel syndrome on left 08/2016    Past Surgical History:  Procedure Laterality Date  . CARPAL TUNNEL RELEASE Right 04/05/2016   Procedure: RIGHT LIMITED OPEN CARPAL TUNNEL RELEASE;  Surgeon: Roseanne Kaufman, MD;  Location: [facility];  Service: Orthopedics;  Laterality: Right;  . CARPAL TUNNEL RELEASE Left 08/19/2016   Procedure: LEFT LIMITED OPEN CARPAL TUNNEL RELEASE;   Surgeon: Roseanne Kaufman, MD;  Location: [facility];  Service: Orthopedics;  Laterality: Left;  . DILATION AND EVACUATION  03/19/2012   Procedure: DILATATION AND EVACUATION;  Surgeon: Floyce Stakes. Pamala Hurry, MD;  Location: [facility];  Service: Gynecology;  Laterality: N/A;  With Ultrasound  . EXCISIONAL BIOSPY LEFT DEEP NECK MASS  10/15/2007   Benign salivary gland tissue w/ chronic inflammation  . WISDOM TOOTH EXTRACTION      Current Medications: Current Outpatient Medications on File Prior to Visit  Medication Sig  . aspirin 81 MG tablet Take 81 mg by mouth daily.  Marland Kitchen estradiol (ESTRACE) 1 MG tablet Take 1 mg by mouth daily.  Marland Kitchen estradiol (VIVELLE-DOT) 0.1 MG/24HR patch Place 1 patch onto the skin 2 (two) times a week.  Marland Kitchen omeprazole (PRILOSEC) 40 MG capsule Take 1 capsule (40 mg total) by mouth daily.  . Prenatal Vit-Fe Fumarate-FA (PRENATAL MULTIVITAMIN) TABS tablet Take 1 tablet by mouth at bedtime.    No current facility-administered medications on file prior to visit.      Allergies:   Patient has no known allergies.   Social History   Socioeconomic History  . Marital status: Married    Spouse name: Not on file  . Number of children: Not on file  . Years of education: Not on file  . Highest education level: Not on file  Occupational History  . Not on file  Social Needs  . Financial resource strain: Not  on file  . Food insecurity:    Worry: Not on file    Inability: Not on file  . Transportation needs:    Medical: Not on file    Non-medical: Not on file  Tobacco Use  . Smoking status: Never Smoker  . Smokeless tobacco: Never Used  Substance and Sexual Activity  . Alcohol use: Yes    Comment: rarely  . Drug use: No  . Sexual activity: Yes  Lifestyle  . Physical activity:    Days per week: Not on file    Minutes per session: Not on file  . Stress: Not on file  Relationships  . Social connections:    Talks on phone: Not on file    Gets together: Not on  file    Attends religious service: Not on file    Active member of club or organization: Not on file    Attends meetings of clubs or organizations: Not on file    Relationship status: Not on file  Other Topics Concern  . Not on file  Social History Narrative   Married, 2 children   Exercise: minimal   Diet: Healthy     Family History: The patient's family history includes Arthritis in her mother; Cancer (age of onset: 22) in her maternal aunt; Glaucoma in her father; Heart attack in her maternal grandmother and paternal grandfather; Heart disease in her maternal grandfather; Heart murmur in her mother; Hyperlipidemia in her mother and paternal grandmother; Migraines in her mother. There is no history of Other. grandparents had heart disease late in life. Mat Gma with HF, afib, CVA. Gena Fray had massive MI at 80-80 and then heart failure. Mother has arrhythmia (not afib), HLD. Father is healthy.   ROS:   Please see the history of present illness.  Additional pertinent ROS:  Constitutional: Negative for chills, fever, night sweats, unintentional weight loss  HENT: Negative for ear pain and hearing loss.   Eyes: Negative for loss of vision and eye pain.  Respiratory: Negative for cough, sputum, shortness of breath, wheezing.   Cardiovascular: Positive for chest. Negative for palpitations, PND, orthopnea, lower extremity edema and claudication.  Gastrointestinal: Negative for abdominal pain, melena, and hematochezia.  Genitourinary: Negative for dysuria and hematuria.  Musculoskeletal: Negative for falls and myalgias.  Skin: Negative for itching and rash.  Neurological: Negative for focal weakness, focal sensory changes and loss of consciousness.  Endo/Heme/Allergies: Does not bruise/bleed easily.    EKGs/Labs/Other Studies Reviewed:    The following studies were reviewed today: CT PE 06/21/18 Cardiovascular: Pulmonary arterial opacification is adequate without emboli identified.  Assessment is mildly limited in some areas due to motion artifact as well as streak artifact from contrast in the SVC. The heart is borderline enlarged. There is no pericardial effusion.  EKG:  EKG is personally reviewed.  The ekg ordered today demonstrates sinus rhythm/sinus arrhythmia, incomplete right bundle branch block  Recent Labs: No results found for requested labs within last 8760 hours.  Recent Lipid Panel    Component Value Date/Time   CHOL 147 03/31/2016 1213   TRIG 37.0 03/31/2016 1213   HDL 59.10 03/31/2016 1213   CHOLHDL 2 03/31/2016 1213   VLDL 7.4 03/31/2016 1213   LDLCALC 80 03/31/2016 1213    Physical Exam:    VS:  BP 112/78   Pulse 76   Ht 5' 5.5" (1.664 m)   Wt 166 lb 12.8 oz (75.7 kg)   LMP 06/23/2018   BMI 27.33 kg/m  Wt Readings from Last 3 Encounters:  06/26/18 166 lb 12.8 oz (75.7 kg)  06/21/18 167 lb (75.8 kg)  08/19/16 157 lb 6.4 oz (71.4 kg)     GEN: Well nourished, well developed in no acute distress HEENT: Normal NECK: No JVD; No carotid bruits LYMPHATICS: No lymphadenopathy CARDIAC: regular rhythm, normal S1 and S2, no murmurs, rubs, gallops. Radial and DP pulses 2+ bilaterally. RESPIRATORY:  Clear to auscultation without rales, wheezing or rhonchi  ABDOMEN: Soft, non-tender, non-distended MUSCULOSKELETAL:  No edema; No deformity  SKIN: Warm and dry NEUROLOGIC:  Alert and oriented x 3 PSYCHIATRIC:  Normal affect   ASSESSMENT:    1. Chest pain, unspecified type   2. Mild cardiomegaly   3. Incomplete right bundle branch block (RBBB) determined by electrocardiography    PLAN:    Chest pain, with mild cardiomegaly on CTA and incomplete right bundle branch block: no evidence of right sided volume overload, but will rule out structural abnormalities/PHTN as cause for chest pain, cardiomegaly, and iRBBB -echocardiogram  If echocardiogram is unremarkable, she is clear to proceed with planned IVF and pregnancy.  Plan for follow up:  as needed based on results of testing  Medication Adjustments/Labs and Tests Ordered: Current medicines are reviewed at length with the patient today.  Concerns regarding medicines are outlined above.  Orders Placed This Encounter  Procedures  . EKG 12-Lead  . ECHOCARDIOGRAM COMPLETE   No orders of the defined types were placed in this encounter.   Patient Instructions  Medication Instructions:  Your Physician recommend you continue on your current medication as directed.    If you need a refill on your cardiac medications before your next appointment, please call your pharmacy.   Lab work: None  Testing/Procedures: Your physician has requested that you have an echocardiogram. Echocardiography is a painless test that uses sound waves to create images of your heart. It provides your doctor with information about the size and shape of your heart and how well your heart's chambers and valves are working. This procedure takes approximately one hour. There are no restrictions for this procedure. [place] [address]   Follow-Up: At [facility], you and your health needs are our priority.  As part of our continuing mission to provide you with exceptional heart care, we have created designated Provider Care Teams.  These Care Teams include your primary Cardiologist (physician) and Advanced Practice Providers (APPs -  Physician Assistants and Nurse Practitioners) who all work together to provide you with the care you need, when you need it. You will need a follow up appointment as needed.  Please call our office 2 months in advance to schedule this appointment.  You may see Dr. Harrell Gave or one of the following Advanced Practice Providers on your designated Care Team:   Rosaria Ferries, PA-C . Jory Sims, DNP, ANP  Any Other Special Instructions Will Be Listed Below (If Applicable).       Signed, Buford Dresser, MD PhD 06/26/2018 4:00 PM    [place]

## 2018-06-26 NOTE — Telephone Encounter (Signed)
Ok

## 2018-06-26 NOTE — Patient Instructions (Signed)
Medication Instructions:  Your Physician recommend you continue on your current medication as directed.    If you need a refill on your cardiac medications before your next appointment, please call your pharmacy.   Lab work: None  Testing/Procedures: Your physician has requested that you have an echocardiogram. Echocardiography is a painless test that uses sound waves to create images of your heart. It provides your doctor with information about the size and shape of your heart and how well your heart's chambers and valves are working. This procedure takes approximately one hour. There are no restrictions for this procedure. [place] [address]   Follow-Up: At [facility], you and your health needs are our priority.  As part of our continuing mission to provide you with exceptional heart care, we have created designated Provider Care Teams.  These Care Teams include your primary Cardiologist (physician) and Advanced Practice Providers (APPs -  Physician Assistants and Nurse Practitioners) who all work together to provide you with the care you need, when you need it. You will need a follow up appointment as needed.  Please call our office 2 months in advance to schedule this appointment.  You may see Dr. Harrell Gave or one of the following Advanced Practice Providers on your designated Care Team:   Rosaria Ferries, PA-C . Jory Sims, DNP, ANP  Any Other Special Instructions Will Be Listed Below (If Applicable).

## 2018-06-28 ENCOUNTER — Encounter: Payer: Self-pay | Admitting: Family Medicine

## 2018-06-28 ENCOUNTER — Other Ambulatory Visit: Payer: Self-pay

## 2018-06-28 ENCOUNTER — Ambulatory Visit (HOSPITAL_COMMUNITY): Payer: 59 | Attending: Cardiovascular Disease

## 2018-06-28 DIAGNOSIS — R079 Chest pain, unspecified: Secondary | ICD-10-CM | POA: Diagnosis not present

## 2018-07-02 DIAGNOSIS — Z31430 Encounter of female for testing for genetic disease carrier status for procreative management: Secondary | ICD-10-CM | POA: Diagnosis not present

## 2018-07-02 DIAGNOSIS — N979 Female infertility, unspecified: Secondary | ICD-10-CM | POA: Diagnosis not present

## 2018-07-02 DIAGNOSIS — Z3183 Encounter for assisted reproductive fertility procedure cycle: Secondary | ICD-10-CM | POA: Diagnosis not present

## 2018-07-03 DIAGNOSIS — N979 Female infertility, unspecified: Secondary | ICD-10-CM | POA: Diagnosis not present

## 2018-07-03 DIAGNOSIS — Z3183 Encounter for assisted reproductive fertility procedure cycle: Secondary | ICD-10-CM | POA: Diagnosis not present

## 2018-07-03 MED FILL — METHYLPREDNISOLONE 4 MG TAB: 4 | 4 days supply | Qty: 16 | Fill #1

## 2018-07-03 MED FILL — PROGESTERONE OIL 50 MG/ML V: 50 | 30 days supply | Qty: 30 | Fill #1

## 2018-07-03 MED FILL — ESTRADIOL 2 MG TABLET: 2 | 30 days supply | Qty: 60 | Fill #1

## 2018-07-03 MED FILL — ESTRADIOL 0.1 MG PATCH: 0.1 | 28 days supply | Qty: 8 | Fill #2

## 2018-07-04 DIAGNOSIS — Z3183 Encounter for assisted reproductive fertility procedure cycle: Secondary | ICD-10-CM | POA: Diagnosis not present

## 2018-07-04 DIAGNOSIS — N979 Female infertility, unspecified: Secondary | ICD-10-CM | POA: Diagnosis not present

## 2018-07-04 MED FILL — VIRT-C DHA SOFTGEL: 53.5-38-1 | 90 days supply | Qty: 90 | Fill #2

## 2018-07-09 DIAGNOSIS — Z3183 Encounter for assisted reproductive fertility procedure cycle: Secondary | ICD-10-CM | POA: Diagnosis not present

## 2018-07-10 DIAGNOSIS — H10413 Chronic giant papillary conjunctivitis, bilateral: Secondary | ICD-10-CM | POA: Diagnosis not present

## 2018-07-10 DIAGNOSIS — H5213 Myopia, bilateral: Secondary | ICD-10-CM | POA: Diagnosis not present

## 2018-07-17 DIAGNOSIS — Z3200 Encounter for pregnancy test, result unknown: Secondary | ICD-10-CM | POA: Diagnosis not present

## 2018-07-19 DIAGNOSIS — Z3200 Encounter for pregnancy test, result unknown: Secondary | ICD-10-CM | POA: Diagnosis not present

## 2018-07-20 DIAGNOSIS — O0281 Inappropriate change in quantitative human chorionic gonadotropin (hCG) in early pregnancy: Secondary | ICD-10-CM | POA: Diagnosis not present

## 2018-07-23 DIAGNOSIS — N979 Female infertility, unspecified: Secondary | ICD-10-CM | POA: Diagnosis not present

## 2018-08-02 DIAGNOSIS — Z319 Encounter for procreative management, unspecified: Secondary | ICD-10-CM | POA: Diagnosis not present

## 2018-08-02 MED FILL — BD NEEDLES 30GX0.5: 30G X 1/2" | 20 days supply | Qty: 20 | Fill #0

## 2018-08-02 MED FILL — DOXYCYCLINE HYCLATE 100 MG: 100 | 10 days supply | Qty: 20 | Fill #0

## 2018-08-02 MED FILL — GONAL-F 1,050 UNITS VIAL: 1050 | 3 days supply | Qty: 3 | Fill #0

## 2018-08-02 MED FILL — BD 3 ML SYRINGE 18GX1-1/2: 18G X 1-1/2 | 30 days supply | Qty: 30 | Fill #0

## 2018-08-03 MED FILL — PREGNYL 10,000 UNITS VIAL: 10000 | 1 days supply | Qty: 1 | Fill #0

## 2018-08-07 ENCOUNTER — Ambulatory Visit (INDEPENDENT_AMBULATORY_CARE_PROVIDER_SITE_OTHER): Payer: 59 | Admitting: Family Medicine

## 2018-08-07 ENCOUNTER — Encounter: Payer: Self-pay | Admitting: Family Medicine

## 2018-08-07 VITALS — BP 110/80 | HR 64 | Temp 97.8°F | Ht 65.5 in | Wt 170.0 lb

## 2018-08-07 DIAGNOSIS — Z3169 Encounter for other general counseling and advice on procreation: Secondary | ICD-10-CM

## 2018-08-07 NOTE — Progress Notes (Signed)
   Subjective:    Patient ID: [name], female    DOB: 08-20-1980, 37 y.o.   MRN: [id number]  HPI  Pt did not need appt.. just needed referral to infertility specialist given she is changing insurance.  she has been seeing this reproductive ENDo for years: Dr. Kerin Perna.   No charge   Review of Systems     Objective:   Physical Exam        Assessment & Plan:

## 2018-08-07 NOTE — Patient Instructions (Signed)
Please stop at the front desk to set up referral.  

## 2018-08-13 ENCOUNTER — Ambulatory Visit: Payer: 59 | Admitting: Cardiovascular Disease

## 2018-09-07 ENCOUNTER — Encounter: Payer: Self-pay | Admitting: Family Medicine

## 2018-09-07 ENCOUNTER — Ambulatory Visit (INDEPENDENT_AMBULATORY_CARE_PROVIDER_SITE_OTHER): Payer: No Typology Code available for payment source | Admitting: Family Medicine

## 2018-09-07 VITALS — BP 104/78 | HR 70 | Temp 98.3°F | Ht 65.5 in | Wt 169.5 lb

## 2018-09-07 DIAGNOSIS — Z0000 Encounter for general adult medical examination without abnormal findings: Secondary | ICD-10-CM | POA: Diagnosis not present

## 2018-09-07 DIAGNOSIS — I4510 Unspecified right bundle-branch block: Secondary | ICD-10-CM | POA: Diagnosis not present

## 2018-09-07 DIAGNOSIS — G43009 Migraine without aura, not intractable, without status migrainosus: Secondary | ICD-10-CM | POA: Diagnosis not present

## 2018-09-07 DIAGNOSIS — Z13220 Encounter for screening for lipoid disorders: Secondary | ICD-10-CM

## 2018-09-07 DIAGNOSIS — L819 Disorder of pigmentation, unspecified: Secondary | ICD-10-CM | POA: Insufficient documentation

## 2018-09-07 LAB — COMPREHENSIVE METABOLIC PANEL
ALBUMIN: 4.3 g/dL (ref 3.5–5.2)
ALK PHOS: 28 U/L — AB (ref 39–117)
ALT: 17 U/L (ref 0–35)
AST: 18 U/L (ref 0–37)
BILIRUBIN TOTAL: 0.6 mg/dL (ref 0.2–1.2)
BUN: 10 mg/dL (ref 6–23)
CO2: 28 mEq/L (ref 19–32)
CREATININE: 0.65 mg/dL (ref 0.40–1.20)
Calcium: 9.4 mg/dL (ref 8.4–10.5)
Chloride: 101 mEq/L (ref 96–112)
GFR: 101.56 mL/min (ref >60.00)
Glucose, Bld: 83 mg/dL (ref 70–99)
Potassium: 3.9 mEq/L (ref 3.5–5.1)
SODIUM: 135 meq/L (ref 135–145)
Total Protein: 7.2 g/dL (ref 6.0–8.3)

## 2018-09-07 LAB — LIPID PANEL
Cholesterol: 148 mg/dL (ref 0–200)
HDL: 54.7 mg/dL (ref >39.00)
LDL Cholesterol: 79 mg/dL (ref 0–99)
NonHDL: 93.16
Total CHOL/HDL Ratio: 3
Triglycerides: 70 mg/dL (ref 0.0–149.0)
VLDL: 14 mg/dL (ref 0.0–40.0)

## 2018-09-07 NOTE — Assessment & Plan Note (Signed)
Menstrual association. Acute management effective and tolerated

## 2018-09-07 NOTE — Assessment & Plan Note (Signed)
Red flags: darkening, changing mole... reassured by small size , uniform borders and color.  Refer to derm for possible biopsy.Marland Kitchen NONurgent.

## 2018-09-07 NOTE — Progress Notes (Signed)
Subjective:    Patient ID: [name], female    DOB: Oct 28, 1980, 38 y.o.   MRN: [id number]  HPI   The patient is here for annual wellness exam and preventative care.    Hx of RBBB and gestational HTN.  BP Readings from Last 3 Encounters:  09/07/18 104/78  08/07/18 110/80  06/26/18 112/78   Migraine, common:  With IVF estrogen. Otherwise  With menses. Using ibuprofen to treat.   Infertility treatment.   Exercise: off and on  Diet: moderate  Social History /Family History/Past Medical History reviewed in detail and updated in EMR if needed. Blood pressure 104/78, pulse 70, temperature 98.3 F (36.8 C), temperature source Oral, height 5' 5.5" (1.664 m), weight 169 lb 8 oz (76.9 kg), SpO2 98 %.  Review of Systems  Constitutional: Negative for fatigue and fever.  HENT: Negative for congestion.   Eyes: Negative for pain.  Respiratory: Negative for cough and shortness of breath.   Cardiovascular: Negative for chest pain, palpitations and leg swelling.  Gastrointestinal: Negative for abdominal pain.  Genitourinary: Negative for dysuria and vaginal bleeding.  Musculoskeletal: Negative for back pain.  Neurological: Negative for syncope, light-headedness and headaches.  Psychiatric/Behavioral: Negative for dysphoric mood.       Objective:   Physical Exam Constitutional:      General: She is not in acute distress.    Appearance: Normal appearance. She is well-developed. She is not ill-appearing or toxic-appearing.  HENT:     Head: Normocephalic.     Right Ear: Hearing, tympanic membrane, ear canal and external ear normal.     Left Ear: Hearing, tympanic membrane, ear canal and external ear normal.     Nose: Nose normal.  Eyes:     General: Lids are normal. Lids are everted, no foreign bodies appreciated.     Conjunctiva/sclera: Conjunctivae normal.     Pupils: Pupils are equal, round, and reactive to light.  Neck:     Musculoskeletal: Normal range of motion and neck  supple.     Thyroid: No thyroid mass or thyromegaly.     Vascular: No carotid bruit.     Trachea: Trachea normal.  Cardiovascular:     Rate and Rhythm: Normal rate and regular rhythm.     Heart sounds: Normal heart sounds, S1 normal and S2 normal. No murmur. No gallop.   Pulmonary:     Effort: Pulmonary effort is normal. No respiratory distress.     Breath sounds: Normal breath sounds. No wheezing, rhonchi or rales.  Abdominal:     General: Bowel sounds are normal. There is no distension or abdominal bruit.     Palpations: Abdomen is soft. There is no fluid wave or mass.     Tenderness: There is no abdominal tenderness. There is no guarding or rebound.     Hernia: No hernia is present.  Lymphadenopathy:     Cervical: No cervical adenopathy.  Skin:    General: Skin is warm and dry.     Findings: No rash.  Neurological:     Mental Status: She is alert.     Cranial Nerves: No cranial nerve deficit.     Sensory: No sensory deficit.  Psychiatric:        Mood and Affect: Mood is not anxious or depressed.        Speech: Speech normal.        Behavior: Behavior normal. Behavior is cooperative.        Judgment: Judgment normal.  Left temple darkening new lesion, no bleeding 3 mm diameter, uniform edges, not itchy    Assessment & Plan:  The patient's preventative maintenance and recommended screening tests for an annual wellness exam were reviewed in full today. Brought up to date unless services declined.  Counselled on the importance of diet, exercise, and its role in overall health and mortality. The patient's FH and SH was reviewed, including their home life, tobacco status, and drug and alcohol status.   Vaccines: Uptodate with flu and tdap Pap/DVE:  07/2017 per Dr. Pamala Hurry Mammo:  Not indicated, no early family history of breast cancer Colon:  No early family history of colon cancer Smoking Status: none ETOH/ drug PFX:TKWI/OXBD  HIV screen:   has had in past

## 2018-09-07 NOTE — Patient Instructions (Addendum)
Please stop at the lab to have labs drawn.  We will call you for dermatology referral.

## 2018-09-19 MED FILL — ESTRADIOL 2 MG TABLET: 2 | 30 days supply | Qty: 60 | Fill #2

## 2018-09-19 MED FILL — METHYLPREDNISOLONE 4 MG TAB: 4 | 4 days supply | Qty: 16 | Fill #2

## 2018-09-19 MED FILL — ESTRADIOL 0.1 MG PATCH: 0.1 | 28 days supply | Qty: 8 | Fill #2

## 2018-09-25 MED FILL — NEUPOGEN 300 MCG/0.5 ML SYR: 300 | 1 days supply | Qty: 1 | Fill #0

## 2018-09-26 MED FILL — BD NEEDLES 22GX1.5": 22G X 1-1/2 | 30 days supply | Qty: 30 | Fill #1

## 2018-09-26 MED FILL — BD NEEDLES 22GX1.5: 22G X 1-1/2 | 30 days supply | Qty: 30 | Fill #1

## 2018-10-08 MED FILL — TOBRAMYCIN 0.3 % SOLN: 0.3 | 17 days supply | Qty: 5 | Fill #0

## 2018-10-15 MED FILL — BD NEEDLES 22GX1.5: 22G X 1-1/2 | 30 days supply | Qty: 30 | Fill #0

## 2018-10-15 MED FILL — VIRT-C DHA SOFTGEL: 53.5-38-1 | 90 days supply | Qty: 90 | Fill #0

## 2018-10-15 MED FILL — BD NEEDLES 22GX1.5": 22G X 1-1/2 | 30 days supply | Qty: 30 | Fill #0

## 2018-10-15 MED FILL — ESTRADIOL 2 MG TABLET: 2 | 30 days supply | Qty: 60 | Fill #0

## 2018-10-15 MED FILL — ESTRADIOL 0.1 MG PATCH: 0.1 | 28 days supply | Qty: 8 | Fill #0

## 2018-10-15 MED FILL — PROGESTERONE OIL 50 MG/ML V: 50 | 30 days supply | Qty: 30 | Fill #2

## 2019-01-17 MED FILL — DAPSONE 5% GEL: 5 | 30 days supply | Qty: 60 | Fill #0

## 2019-01-21 MED FILL — VIRT-C DHA SOFTGEL: 53.5-38-1 | 90 days supply | Qty: 90 | Fill #1

## 2019-02-25 ENCOUNTER — Other Ambulatory Visit: Payer: Self-pay | Admitting: *Deleted

## 2019-02-25 DIAGNOSIS — Z20822 Contact with and (suspected) exposure to covid-19: Secondary | ICD-10-CM

## 2019-02-25 NOTE — Addendum Note (Signed)
Addended by: Benjy Kana M on: 02/25/2019 08:17 PM   Modules accepted: Orders  

## 2019-02-27 LAB — NOVEL CORONAVIRUS, NAA: SARS-CoV-2, NAA: NOT DETECTED

## 2019-03-19 ENCOUNTER — Telehealth: Payer: No Typology Code available for payment source | Admitting: Family

## 2019-03-19 DIAGNOSIS — H9203 Otalgia, bilateral: Secondary | ICD-10-CM

## 2019-03-19 MED ORDER — NEOMYCIN-POLYMYXIN-HC 3.5-10000-1 OT SOLN: 4.0000 [drp] | Freq: Four times a day (QID) | OTIC | 0 refills | Status: DC

## 2019-03-19 NOTE — Progress Notes (Signed)
E Visit for Swimmer's Ear  We are sorry that you are not feeling well. Here is how we plan to help!  I have prescribed: Neomycin 0.35%, polymyxin B 10,000 units/mL, and hydrocortisone 0,5% otic solution 4 drops in affected ears four times a day for 7 days   In certain cases swimmer's ear may progress to a more serious bacterial infection of the middle or inner ear.  If you have a fever 102 and up and significantly worsening symptoms, this could indicate a more serious infection moving to the middle/inner and needs face to face evaluation in an office by a provider.  Your symptoms should improve over the next 3 days and should resolve in about 7 days.  HOME CARE:   Wash your hands frequently.  Do not place the tip of the bottle on your ear or touch it with your fingers.  You can take Acetominophen 650 mg every 4-6 hours as needed for pain.  If pain is severe or moderate, you can apply a heating pad (set on low) or hot water bottle (wrapped in a towel) to outer ear for 20 minutes.  This will also increase drainage.  Avoid ear plugs  Do not use Q-tips  After showers, help the water run out by tilting your head to one side.  GET HELP RIGHT AWAY IF:   Fever is over 102.2 degrees.  You develop progressive ear pain or hearing loss.  Ear symptoms persist longer than 3 days after treatment.  MAKE SURE YOU:   Understand these instructions.  Will watch your condition.  Will get help right away if you are not doing well or get worse.  TO PREVENT SWIMMER'S EAR:  Use a bathing cap or custom fitted swim molds to keep your ears dry.  Towel off after swimming to dry your ears.  Tilt your head or pull your earlobes to allow the water to escape your ear canal.  If there is still water in your ears, consider using a hairdryer on the lowest setting.  Thank you for choosing an e-visit. Your e-visit answers were reviewed by a board certified advanced clinical practitioner to complete  your personal care plan. Depending upon the condition, your plan could have included both over the counter or prescription medications. Please review your pharmacy choice. Be sure that the pharmacy you have chosen is open so that you can pick up your prescription now.  If there is a problem you may message your provider in MyChart to have the prescription routed to another pharmacy. Your safety is important to us. If you have drug allergies check your prescription carefully.  For the next 24 hours, you can use MyChart to ask questions about today's visit, request a non-urgent call back, or ask for a work or school excuse from your e-visit provider. You will get an email in the next two days asking about your experience. I hope that your e-visit has been valuable and will speed your recovery.    Greater than 5 minutes, yet less than 10 minutes of time have been spent researching, coordinating, and implementing care for this patient today.  Thank you for the details you included in the comment boxes. Those details are very helpful in determining the best course of treatment for you and help us to provide the best care.  

## 2019-03-20 ENCOUNTER — Inpatient Hospital Stay: Admission: RE | Admit: 2019-03-20 | Payer: No Typology Code available for payment source | Source: Ambulatory Visit

## 2019-03-21 ENCOUNTER — Ambulatory Visit: Payer: No Typology Code available for payment source | Admitting: Family Medicine

## 2019-03-26 ENCOUNTER — Ambulatory Visit: Payer: No Typology Code available for payment source | Admitting: Family Medicine

## 2019-03-29 ENCOUNTER — Other Ambulatory Visit: Payer: Self-pay

## 2019-03-29 ENCOUNTER — Ambulatory Visit (INDEPENDENT_AMBULATORY_CARE_PROVIDER_SITE_OTHER): Payer: No Typology Code available for payment source | Admitting: Family Medicine

## 2019-03-29 ENCOUNTER — Encounter: Payer: Self-pay | Admitting: Family Medicine

## 2019-03-29 VITALS — BP 104/78 | HR 82 | Temp 98.4°F | Ht 65.5 in | Wt 171.1 lb

## 2019-03-29 DIAGNOSIS — K115 Sialolithiasis: Secondary | ICD-10-CM

## 2019-03-29 DIAGNOSIS — R0982 Postnasal drip: Secondary | ICD-10-CM

## 2019-03-29 MED ORDER — MONTELUKAST SODIUM 10 MG PO TABS: 10.0000 mg | ORAL_TABLET | Freq: Every day | ORAL | 3 refills | Status: DC

## 2019-03-29 NOTE — Patient Instructions (Signed)
Call if salivary gland issue recurrent for ENT referral.  Start singulair and or flonase for allergy symptoms.

## 2019-03-29 NOTE — Progress Notes (Signed)
Chief Complaint  Patient presents with  . Follow-up    [facility] - blocked salivary gland. Treated with abx, resolved about 5-6 days later. Pt doing well. Tested for Covid while in [place] - Negative    History of Present Illness: HPI    38 year old female presents for follow up salivary gland stone.  Started with runny nose on 03/16/2019.. had gone to [facility].  Few days later. Pain in left cheek. Followed by swelling in left cheek, below jaw line.  Started with tylenol, lemon drops.    [place] urgent care [place] n [place].Marland Kitchen dx with saliivary gland stone.. treated just in case with Augmentin...resolved after 4-5 days.  COVID19 negative.  No further congestion, no cough, no fever.  Improved once she came back to [place].   Over past year she has had post nasal drip, ST off and on, itchy throat.  She has tried claritin without help.Marland Kitchen and made her tired.  Intermittant but not seasonally. No environmental association. She feels it may be worse menses    COVID 19 screen No recent travel or known exposure to [place] The patient denies respiratory symptoms of COVID 19 at this time.  The importance of social distancing was discussed today.   Review of Systems  Constitutional: Negative for chills and fever.  HENT: Negative for congestion and ear pain.   Eyes: Negative for pain and redness.  Respiratory: Negative for cough and shortness of breath.   Cardiovascular: Negative for chest pain, palpitations and leg swelling.  Gastrointestinal: Negative for abdominal pain, blood in stool, constipation, diarrhea, nausea and vomiting.  Genitourinary: Negative for dysuria.  Musculoskeletal: Negative for falls and myalgias.  Skin: Negative for rash.  Neurological: Negative for dizziness.  Psychiatric/Behavioral: Negative for depression. The patient is not nervous/anxious.       Past Medical History:  Diagnosis Date  . Carpal tunnel syndrome on left 08/2016    reports that she has never smoked.  She has never used smokeless tobacco. She reports current alcohol use. She reports that she does not use drugs.   Current Outpatient Medications:  .  omeprazole (PRILOSEC) 40 MG capsule, Take 1 capsule (40 mg total) by mouth daily., Disp: 30 capsule, Rfl: 0 .  Prenatal Vit-Fe Fumarate-FA (PRENATAL MULTIVITAMIN) TABS tablet, Take 1 tablet by mouth at bedtime. , Disp: , Rfl:    Observations/Objective: Blood pressure 104/78, pulse 82, temperature 98.4 F (36.9 C), temperature source Temporal, height 5' 5.5" (1.664 m), weight 171 lb 1.9 oz (77.6 kg), SpO2 98 %.  Physical Exam Constitutional:      General: She is not in acute distress.    Appearance: Normal appearance. She is well-developed. She is not ill-appearing or toxic-appearing.  HENT:     Head: Normocephalic.     Right Ear: Hearing, tympanic membrane, ear canal and external ear normal. Tympanic membrane is not erythematous, retracted or bulging.     Left Ear: Hearing, tympanic membrane, ear canal and external ear normal. Tympanic membrane is not erythematous, retracted or bulging.     Nose: No mucosal edema or rhinorrhea.     Right Sinus: No maxillary sinus tenderness or frontal sinus tenderness.     Left Sinus: No maxillary sinus tenderness or frontal sinus tenderness.     Mouth/Throat:     Pharynx: Uvula midline.  Eyes:     General: Lids are normal. Lids are everted, no foreign bodies appreciated.     Conjunctiva/sclera: Conjunctivae normal.     Pupils:  Pupils are equal, round, and reactive to light.  Neck:     Musculoskeletal: Normal range of motion and neck supple.     Thyroid: No thyroid mass or thyromegaly.     Vascular: No carotid bruit.     Trachea: Trachea normal.  Cardiovascular:     Rate and Rhythm: Normal rate and regular rhythm.     Pulses: Normal pulses.     Heart sounds: Normal heart sounds, S1 normal and S2 normal. No murmur. No friction rub. No gallop.   Pulmonary:     Effort: Pulmonary effort is normal. No  tachypnea or respiratory distress.     Breath sounds: Normal breath sounds. No decreased breath sounds, wheezing, rhonchi or rales.  Abdominal:     General: Bowel sounds are normal.     Palpations: Abdomen is soft.     Tenderness: There is no abdominal tenderness.  Skin:    General: Skin is warm and dry.     Findings: No rash.  Neurological:     Mental Status: She is alert.  Psychiatric:        Mood and Affect: Mood is not anxious or depressed.        Speech: Speech normal.        Behavior: Behavior normal. Behavior is cooperative.        Thought Content: Thought content normal.        Judgment: Judgment normal.      Assessment and Plan   Stone of salivary gland or duct Symptoms resolved.  Post-nasal drip Will try trial of Singulair given antihistmine not helpful.     Eliezer Lofts, MD

## 2019-04-01 MED FILL — MONTELUKAST SOD 10 MG TAB: 10 | 30 days supply | Qty: 30 | Fill #0

## 2019-04-02 ENCOUNTER — Other Ambulatory Visit: Payer: Self-pay

## 2019-04-02 DIAGNOSIS — Z20822 Contact with and (suspected) exposure to covid-19: Secondary | ICD-10-CM

## 2019-04-03 LAB — NOVEL CORONAVIRUS, NAA: SARS-CoV-2, NAA: NOT DETECTED

## 2019-04-13 DIAGNOSIS — K115 Sialolithiasis: Secondary | ICD-10-CM | POA: Insufficient documentation

## 2019-04-13 DIAGNOSIS — R0982 Postnasal drip: Secondary | ICD-10-CM | POA: Insufficient documentation

## 2019-04-13 NOTE — Assessment & Plan Note (Signed)
Symptoms resolved 

## 2019-04-13 NOTE — Assessment & Plan Note (Signed)
Will try trial of Singulair given antihistmine not helpful.

## 2019-05-27 IMAGING — CT CT ANGIO CHEST
2 of 6 series · 19 of 46 positions shown · IV contrast (APPLIED)
Comparison: None.

CLINICAL DATA: Right-sided chest pain for 1 week. Elevated D-dimer.

EXAM:
CT ANGIOGRAPHY CHEST WITH CONTRAST
TECHNIQUE: Multidetector CT imaging of the chest was performed using the
standard protocol during bolus administration of intravenous
contrast. Multiplanar CT image reconstructions and MIPs were
obtained to evaluate the vascular anatomy.
CONTRAST:  100mL 2COWPR-M5F IOPAMIDOL (2COWPR-M5F) INJECTION 76%

[Series 5: thins · axial · 0.59mm/px · z∈[-246,+14]mm · 17 of 286 slices shown]
[im 13/286  lung]
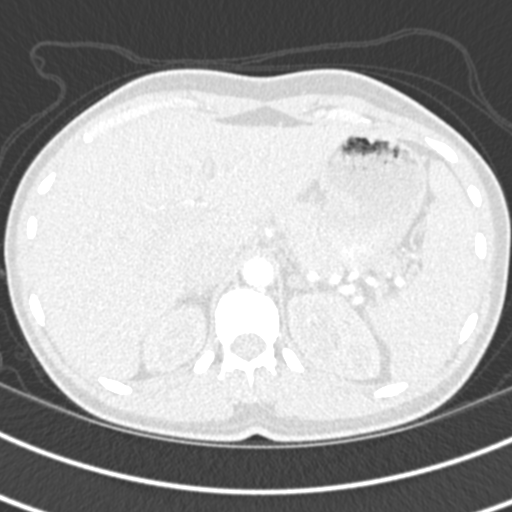
[im 25/286  soft-tissue]
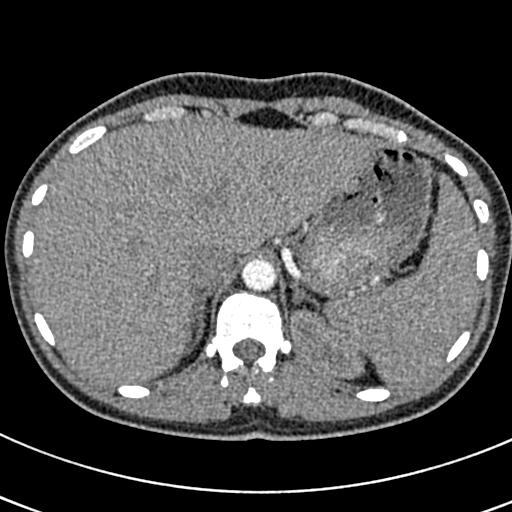
[im 50/286  lung]
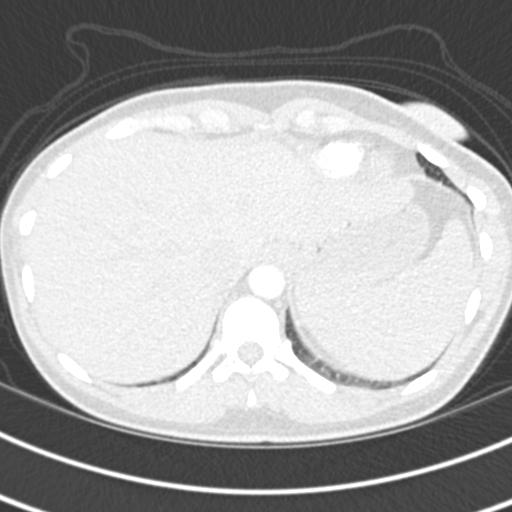
[im 62/286  soft-tissue]
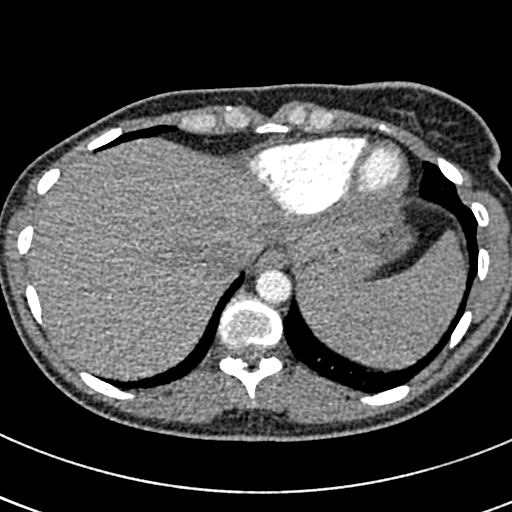
[im 75/286  lung]
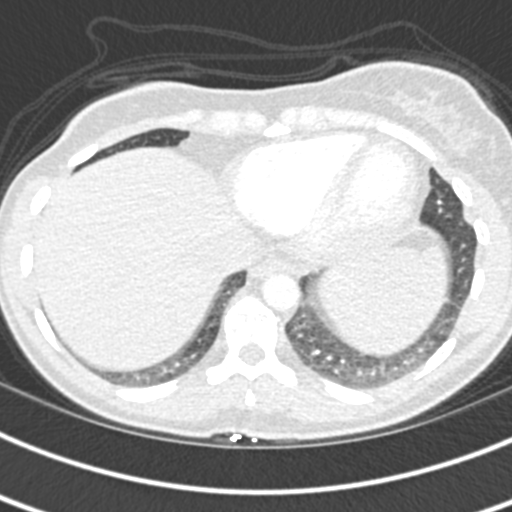
[im 100/286  soft-tissue]
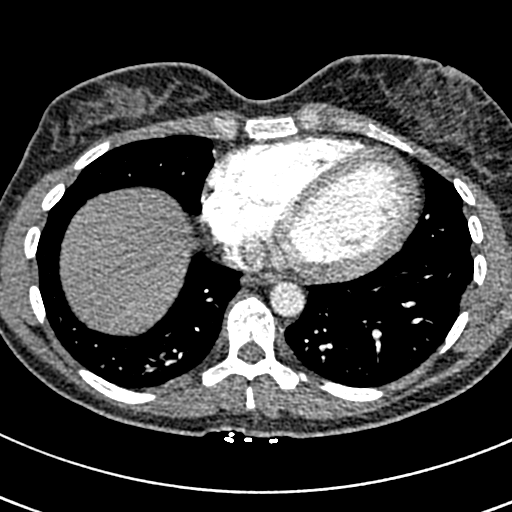
[im 112/286  lung]
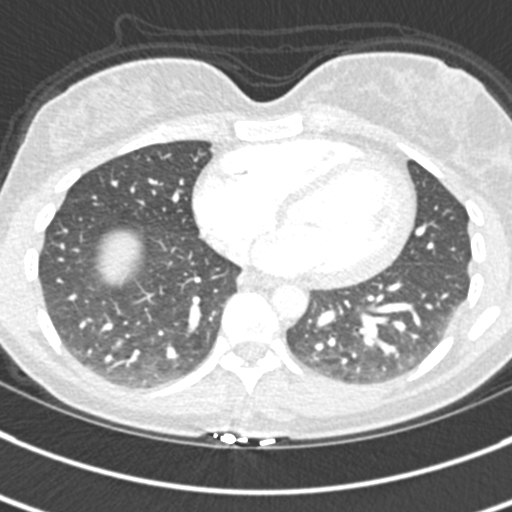
[im 124/286  soft-tissue]
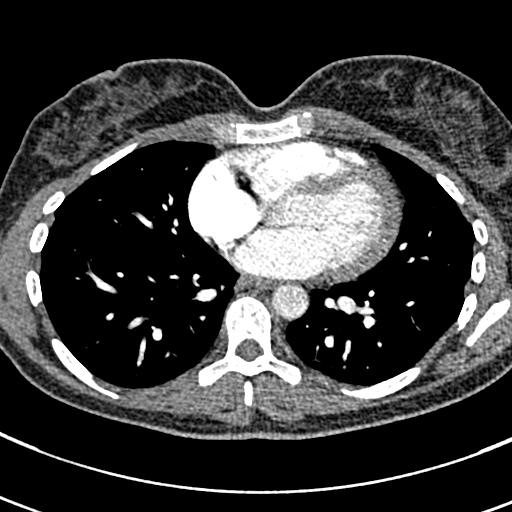
[im 149/286  lung]
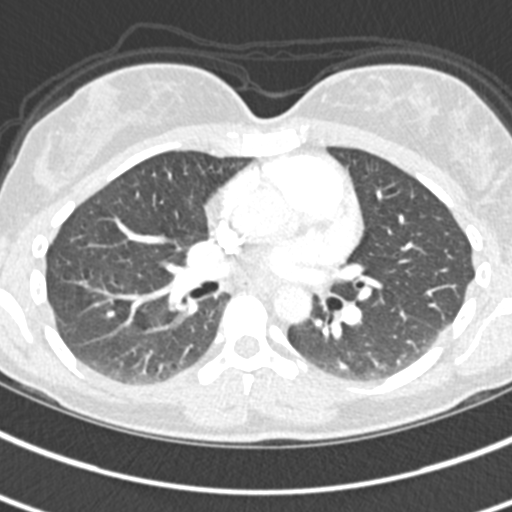
[im 162/286  soft-tissue]
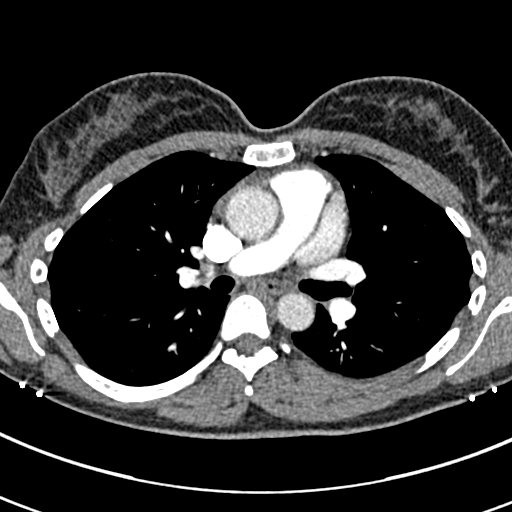
[im 174/286  lung]
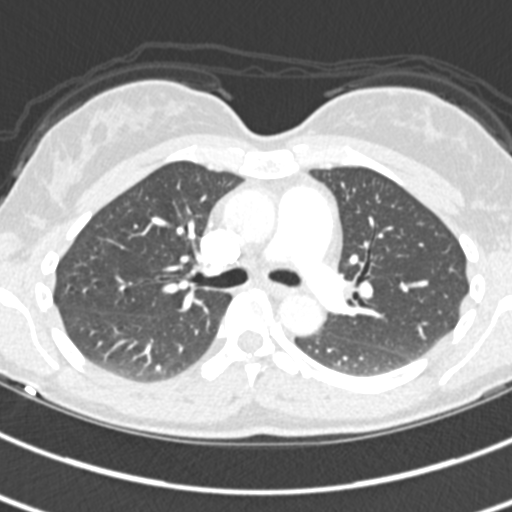
[im 186/286  soft-tissue]
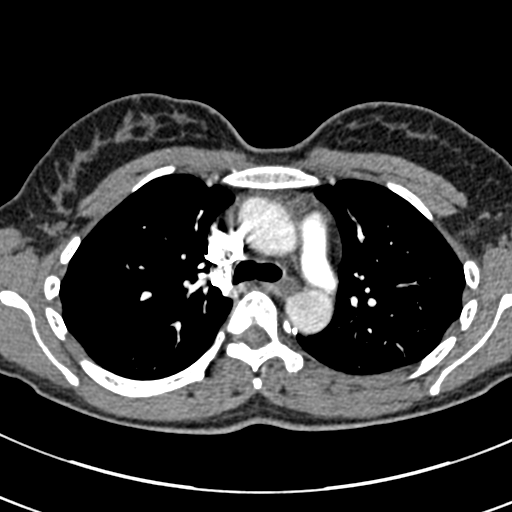
[im 211/286  lung]
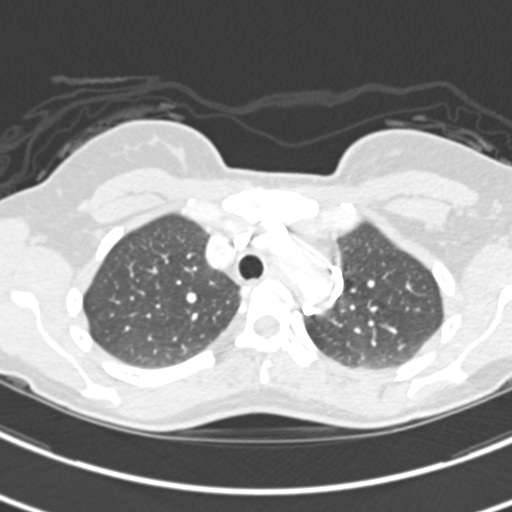
[im 224/286  soft-tissue]
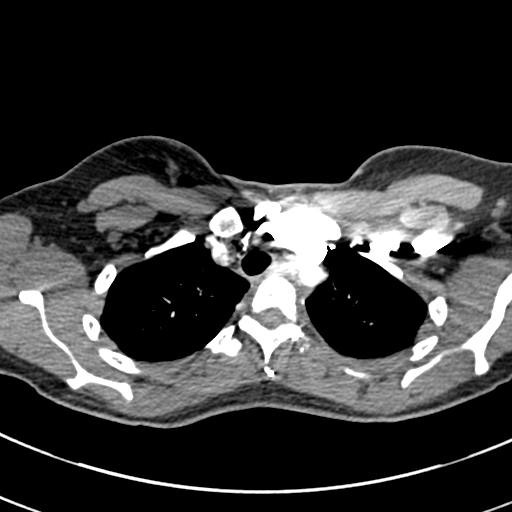
[im 236/286  lung]
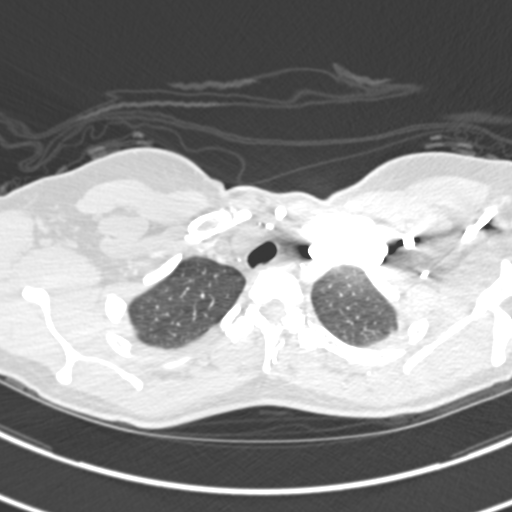
[im 261/286  soft-tissue]
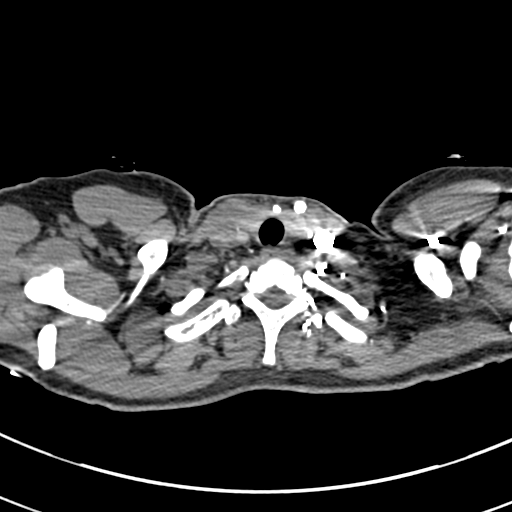
[im 273/286  lung]
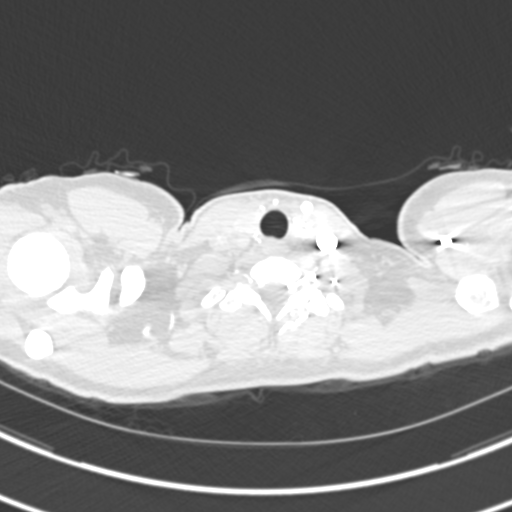

[Series 7: coronal mpr · coronal · 0.59mm/px · 2 of 76 slices shown]
[im 26/76  soft-tissue]
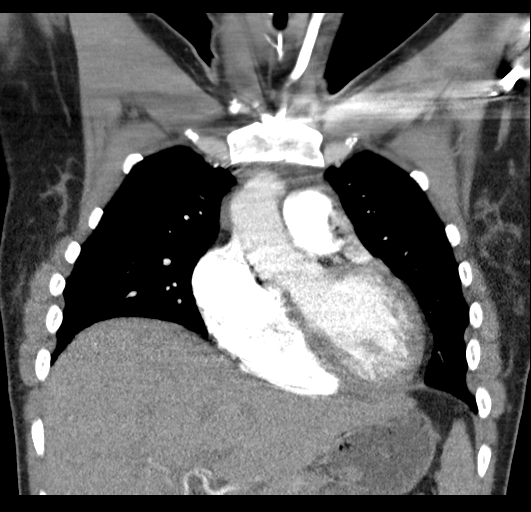
[im 51/76  soft-tissue]
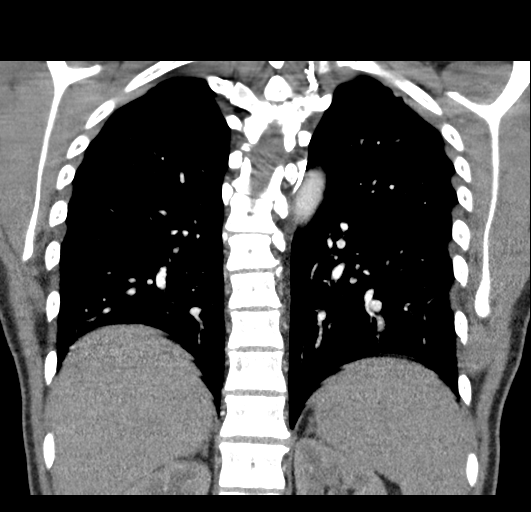

[19 of 46 positions shown; findings below may reference images not displayed]

FINDINGS: Cardiovascular: Pulmonary arterial opacification is adequate without
emboli identified. Assessment is mildly limited in some areas due to
motion artifact as well as streak artifact from contrast in the SVC.
The heart is borderline enlarged. There is no pericardial effusion.

Mediastinum/Nodes: No enlarged axillary, mediastinal, or hilar lymph
nodes. Grossly unremarkable thyroid and esophagus.

Lungs/Pleura: No pleural effusion or pneumothorax. 4 mm apical left
upper lobe nodule (series 6, image 23). No consolidation.

Upper Abdomen: Unremarkable.

Musculoskeletal: No acute osseous abnormality or suspicious osseous
lesion. Mild thoracic scoliosis.

Review of the MIP images confirms the above findings.
IMPRESSION: 1. No evidence of pulmonary emboli or other acute abnormality in the
chest.
2. 4 mm left upper lobe lung nodule. No follow-up needed if patient
is low-risk. Non-contrast chest CT can be considered in 12 months if
patient is high-risk. This recommendation follows the consensus
statement: Guidelines for Management of Incidental Pulmonary Nodules
Detected on CT Images: From the [HOSPITAL] 7643; Radiology

## 2019-06-04 ENCOUNTER — Other Ambulatory Visit: Payer: Self-pay

## 2019-06-04 DIAGNOSIS — Z20822 Contact with and (suspected) exposure to covid-19: Secondary | ICD-10-CM

## 2019-06-06 LAB — NOVEL CORONAVIRUS, NAA: SARS-CoV-2, NAA: NOT DETECTED

## 2019-06-12 ENCOUNTER — Other Ambulatory Visit: Payer: Self-pay

## 2019-06-12 DIAGNOSIS — Z20822 Contact with and (suspected) exposure to covid-19: Secondary | ICD-10-CM

## 2019-06-13 LAB — NOVEL CORONAVIRUS, NAA: SARS-CoV-2, NAA: NOT DETECTED

## 2019-06-14 ENCOUNTER — Other Ambulatory Visit: Payer: Self-pay

## 2019-06-14 DIAGNOSIS — Z20822 Contact with and (suspected) exposure to covid-19: Secondary | ICD-10-CM

## 2019-06-15 LAB — NOVEL CORONAVIRUS, NAA: SARS-CoV-2, NAA: NOT DETECTED

## 2019-06-19 ENCOUNTER — Inpatient Hospital Stay (HOSPITAL_COMMUNITY): Admission: RE | Admit: 2019-06-19 | Payer: No Typology Code available for payment source | Source: Home / Self Care

## 2019-09-23 ENCOUNTER — Ambulatory Visit: Payer: No Typology Code available for payment source | Attending: Internal Medicine

## 2019-09-23 DIAGNOSIS — Z20822 Contact with and (suspected) exposure to covid-19: Secondary | ICD-10-CM

## 2019-09-24 LAB — NOVEL CORONAVIRUS, NAA: SARS-CoV-2, NAA: NOT DETECTED

## 2019-10-17 MED FILL — CLOBETASOL PROPIONATE 0.05: 0.05 | 13 days supply | Qty: 45 | Fill #0

## 2019-11-20 ENCOUNTER — Other Ambulatory Visit: Payer: No Typology Code available for payment source

## 2019-11-20 ENCOUNTER — Ambulatory Visit: Payer: No Typology Code available for payment source | Attending: Internal Medicine

## 2019-11-20 DIAGNOSIS — Z20822 Contact with and (suspected) exposure to covid-19: Secondary | ICD-10-CM

## 2019-11-20 LAB — HM MAMMOGRAPHY

## 2019-11-21 LAB — NOVEL CORONAVIRUS, NAA: SARS-CoV-2, NAA: NOT DETECTED

## 2019-11-21 LAB — SARS-COV-2, NAA 2 DAY TAT

## 2020-01-08 ENCOUNTER — Ambulatory Visit: Payer: No Typology Code available for payment source | Attending: Internal Medicine

## 2020-01-08 DIAGNOSIS — Z20822 Contact with and (suspected) exposure to covid-19: Secondary | ICD-10-CM

## 2020-01-09 LAB — NOVEL CORONAVIRUS, NAA: SARS-CoV-2, NAA: NOT DETECTED

## 2020-01-09 LAB — SARS-COV-2, NAA 2 DAY TAT

## 2020-01-10 MED FILL — MONTELUKAST SOD 10 MG TAB: 10 | 30 days supply | Qty: 30 | Fill #0

## 2020-01-10 MED FILL — DAPSONE 5% GEL: 5 | 30 days supply | Qty: 60 | Fill #1

## 2020-04-10 ENCOUNTER — Other Ambulatory Visit: Payer: Self-pay

## 2020-04-10 ENCOUNTER — Other Ambulatory Visit: Payer: No Typology Code available for payment source

## 2020-04-10 DIAGNOSIS — Z20822 Contact with and (suspected) exposure to covid-19: Secondary | ICD-10-CM

## 2020-04-11 LAB — SPECIMEN STATUS REPORT

## 2020-04-11 LAB — NOVEL CORONAVIRUS, NAA: SARS-CoV-2, NAA: NOT DETECTED

## 2020-11-11 ENCOUNTER — Other Ambulatory Visit (HOSPITAL_COMMUNITY): Payer: Self-pay

## 2020-11-11 MED ORDER — COVID-19 AT-HOME TEST VI KIT
PACK | 0 refills | Status: DC
  Filled 2020-11-11: qty 2, 4d supply, fill #0

## 2020-12-01 LAB — HM PAP SMEAR: HM Pap smear: NEGATIVE

## 2020-12-01 LAB — RESULTS CONSOLE HPV: CHL HPV: NEGATIVE

## 2020-12-01 LAB — HM MAMMOGRAPHY

## 2020-12-12 ENCOUNTER — Encounter: Payer: Self-pay | Admitting: Family Medicine

## 2020-12-12 ENCOUNTER — Telehealth (INDEPENDENT_AMBULATORY_CARE_PROVIDER_SITE_OTHER): Payer: No Typology Code available for payment source | Admitting: Family Medicine

## 2020-12-12 VITALS — Temp 99.0°F | Ht 65.5 in

## 2020-12-12 DIAGNOSIS — R6889 Other general symptoms and signs: Secondary | ICD-10-CM | POA: Diagnosis not present

## 2020-12-12 MED ORDER — HYDROCOD POLST-CPM POLST ER 10-8 MG/5ML PO SUER: 5.0000 mL | Freq: Two times a day (BID) | ORAL | 0 refills | Status: DC | PRN

## 2020-12-12 MED ORDER — OSELTAMIVIR PHOSPHATE 75 MG PO CAPS: 75.0000 mg | ORAL_CAPSULE | Freq: Two times a day (BID) | ORAL | 0 refills | Status: AC

## 2020-12-12 MED ORDER — BENZONATATE 100 MG PO CAPS: 100.0000 mg | ORAL_CAPSULE | Freq: Three times a day (TID) | ORAL | 0 refills | Status: DC | PRN

## 2020-12-12 MED ORDER — PREDNISONE 20 MG PO TABS: 40.0000 mg | ORAL_TABLET | Freq: Every day | ORAL | 0 refills | Status: AC

## 2020-12-12 NOTE — Progress Notes (Signed)
Virtual Visit via Video Note  I connected with [name] on 12/12/20 at 11:10 AM EDT by a video enabled telemedicine application and verified that I am speaking with the correct person using two identifiers. Location patient: home Location provider:  home office Persons participating in the virtual visit: patient, provider  I discussed the limitations of evaluation and management by telemedicine and the availability of in person appointments. The patient expressed understanding and agreed to proceed.  Chief Complaint  Patient presents with  . Influenza    Pt c/o possilbe flu// been coughing since Tuesday and fever Wednesday- Thursday, 102, been taking Tylenol and Ibprofen around the clock.  Pt's son tested positive for flu yesterday, pt took rapid covid test yesterday, coming back yesterday    HPI: [name] is a 40 y.o. female who complains of cough x 4 days, fever x 2 days with Tmax 102. + stuffy nose, ear fullness. Cough is keeping her up at night. Her son tested positive for flu yesterday, daughter with covid.   Pt has been taking tylenol and ibuprofen around the clock. She is also taking delsym, sudafed, and guaifenesin.  Rapid covid test x 2 negative, PCR negative. Denies SOB with + DOE with minimal activity  Past Medical History:  Diagnosis Date  . Carpal tunnel syndrome on left 08/2016    Past Surgical History:  Procedure Laterality Date  . CARPAL TUNNEL RELEASE Right 04/05/2016   Procedure: RIGHT LIMITED OPEN CARPAL TUNNEL RELEASE;  Surgeon: William Gramig, MD;  Location: [facility];  Service: Orthopedics;  Laterality: Right;  . CARPAL TUNNEL RELEASE Left 08/19/2016   Procedure: LEFT LIMITED OPEN CARPAL TUNNEL RELEASE;  Surgeon: William Gramig, MD;  Location: [facility];  Service: Orthopedics;  Laterality: Left;  . DILATION AND EVACUATION  03/19/2012   Procedure: DILATATION AND EVACUATION;  Surgeon: Kelly A. Fogleman, MD;  Location:  [facility];  Service: Gynecology;  Laterality: N/A;  With Ultrasound  . EXCISIONAL BIOSPY LEFT DEEP NECK MASS  10/15/2007   Benign salivary gland tissue w/ chronic inflammation  . WISDOM TOOTH EXTRACTION      Family History  Problem Relation Age of Onset  . Migraines Mother   . Hyperlipidemia Mother   . Arthritis Mother   . Heart murmur Mother   . Glaucoma Father   . Cancer Maternal Aunt 34       breast  . Heart attack Maternal Grandmother   . Heart disease Maternal Grandfather   . Hyperlipidemia Paternal Grandmother   . Heart attack Paternal Grandfather   . Other Neg Hx     Social History   Tobacco Use  . Smoking status: Never Smoker  . Smokeless tobacco: Never Used  Substance Use Topics  . Alcohol use: Yes    Comment: rarely  . Drug use: No     Current Outpatient Medications:  .  Dapsone 5 % topical gel, APPLY ON THE SKIN EVERY MORNING, Disp: , Rfl:  .  omeprazole (PRILOSEC) 40 MG capsule, Take 1 capsule (40 mg total) by mouth daily., Disp: 30 capsule, Rfl: 0 .  COVID-19 At-Home Test KIT, Use as directed, Disp: 2 kit, Rfl: 0 .  montelukast (SINGULAIR) 10 MG tablet, Take 1 tablet (10 mg total) by mouth at bedtime. (Patient not taking: Reported on 12/12/2020), Disp: 30 tablet, Rfl: 3  Allergies  Allergen Reactions  . Sulfa Antibiotics Itching and Other (See Comments)    Reaction to Sulfa Eye Drops        ROS: See pertinent positives and negatives per HPI.   EXAM:  VITALS per patient if applicable: Temp 99 F (37.2 C) (Temporal)   Ht 5' 5.5" (1.664 m)   BMI 28.04 kg/m    GENERAL: alert, oriented, in no acute distress but coughing very frequently  HEENT: atraumatic, conjunctiva clear, no obvious abnormalities on inspection of external nose and ears  NECK: normal movements of the head and neck  LUNGS: on inspection no signs of respiratory distress, breathing rate appears normal, no obvious gross SOB, gasping or wheezing, no conversational dyspnea; pt coughs  often and cough sound deep and barking  CV: no obvious cyanosis  PSYCH/NEURO: pleasant and cooperative, speech and thought processing grossly intact   ASSESSMENT AND PLAN: 1. Flu-like symptoms - covid PCR and home tests x 2 all negative - son tested positive for flu yesterday - cont supportive care Rx: - oseltamivir (TAMIFLU) 75 MG capsule; Take 1 capsule (75 mg total) by mouth 2 (two) times daily for 5 days.  Dispense: 10 capsule; Refill: 0 - predniSONE (DELTASONE) 20 MG tablet; Take 2 tablets (40 mg total) by mouth daily with breakfast for 5 days.  Dispense: 10 tablet; Refill: 0 - chlorpheniramine-HYDROcodone (TUSSIONEX PENNKINETIC ER) 10-8 MG/5ML SUER; Take 5 mLs by mouth every 12 (twelve) hours as needed for cough.  Dispense: 115 mL; Refill: 0 - benzonatate (TESSALON) 100 MG capsule; Take 1 capsule (100 mg total) by mouth 3 (three) times daily as needed for cough.  Dispense: 30 capsule; Refill: 0 - f/u if symptoms worsen or do not improve in 5-7 days     I discussed the assessment and treatment plan with the patient. The patient was provided an opportunity to ask questions and all were answered. The patient agreed with the plan and demonstrated an understanding of the instructions.   The patient was advised to call back or seek an in-person evaluation if the symptoms worsen or if the condition fails to improve as anticipated.   Mary Cirigliano, DO   

## 2020-12-12 NOTE — Patient Instructions (Signed)
Health Maintenance Due  Topic Date Due  . COVID-19 Vaccine (1) Never done  . Hepatitis C Screening  Never done  . PAP SMEAR-Modifier  07/21/2020    Depression screen PHQ 2/9 09/07/2018  Decreased Interest 0  Down, Depressed, Hopeless 0  PHQ - 2 Score 0

## 2021-01-06 ENCOUNTER — Other Ambulatory Visit (HOSPITAL_COMMUNITY): Payer: Self-pay

## 2021-01-06 MED ORDER — CARESTART COVID-19 HOME TEST VI KIT
PACK | 0 refills | Status: DC
  Filled 2021-01-06: qty 4, 4d supply, fill #0

## 2021-02-15 ENCOUNTER — Other Ambulatory Visit (HOSPITAL_COMMUNITY): Payer: Self-pay

## 2021-02-15 MED ORDER — CARESTART COVID-19 HOME TEST VI KIT
PACK | 0 refills | Status: DC
  Filled 2021-02-15: qty 4, 4d supply, fill #0

## 2021-03-25 ENCOUNTER — Encounter: Payer: No Typology Code available for payment source | Admitting: Family Medicine

## 2021-04-26 ENCOUNTER — Other Ambulatory Visit (HOSPITAL_COMMUNITY): Payer: Self-pay

## 2021-04-26 MED ORDER — CARESTART COVID-19 HOME TEST VI KIT
PACK | 0 refills | Status: DC
  Filled 2021-04-26: qty 4, 4d supply, fill #0

## 2021-05-04 ENCOUNTER — Encounter: Payer: No Typology Code available for payment source | Admitting: Family Medicine

## 2021-05-10 ENCOUNTER — Telehealth: Payer: Self-pay | Admitting: *Deleted

## 2021-05-10 ENCOUNTER — Other Ambulatory Visit: Payer: Self-pay | Admitting: Family Medicine

## 2021-05-10 DIAGNOSIS — Z13220 Encounter for screening for lipoid disorders: Secondary | ICD-10-CM

## 2021-05-10 NOTE — Telephone Encounter (Signed)
Pt coming in tomorrow morning 05/11/21 for labs for CPE. Please put orders in

## 2021-05-11 ENCOUNTER — Other Ambulatory Visit: Payer: Self-pay

## 2021-05-11 ENCOUNTER — Other Ambulatory Visit (INDEPENDENT_AMBULATORY_CARE_PROVIDER_SITE_OTHER): Payer: No Typology Code available for payment source

## 2021-05-11 DIAGNOSIS — Z13220 Encounter for screening for lipoid disorders: Secondary | ICD-10-CM | POA: Diagnosis not present

## 2021-05-11 LAB — COMPREHENSIVE METABOLIC PANEL
ALT: 10 U/L (ref 0–35)
AST: 14 U/L (ref 0–37)
Albumin: 4.3 g/dL (ref 3.5–5.2)
Alkaline Phosphatase: 33 U/L — ABNORMAL LOW (ref 39–117)
BUN: 10 mg/dL (ref 6–23)
CO2: 27 mEq/L (ref 19–32)
Calcium: 9 mg/dL (ref 8.4–10.5)
Chloride: 102 mEq/L (ref 96–112)
Creatinine, Ser: 0.69 mg/dL (ref 0.40–1.20)
GFR: 107.73 mL/min (ref >60.00)
Glucose, Bld: 86 mg/dL (ref 70–99)
Potassium: 3.9 mEq/L (ref 3.5–5.1)
Sodium: 136 mEq/L (ref 135–145)
Total Bilirubin: 0.5 mg/dL (ref 0.2–1.2)
Total Protein: 7.3 g/dL (ref 6.0–8.3)

## 2021-05-11 LAB — LIPID PANEL
Cholesterol: 138 mg/dL (ref 0–200)
HDL: 51.9 mg/dL (ref >39.00)
LDL Cholesterol: 77 mg/dL (ref 0–99)
NonHDL: 85.72
Total CHOL/HDL Ratio: 3
Triglycerides: 44 mg/dL (ref 0.0–149.0)
VLDL: 8.8 mg/dL (ref 0.0–40.0)

## 2021-05-14 ENCOUNTER — Other Ambulatory Visit: Payer: Self-pay

## 2021-05-14 ENCOUNTER — Ambulatory Visit (INDEPENDENT_AMBULATORY_CARE_PROVIDER_SITE_OTHER): Payer: No Typology Code available for payment source | Admitting: Family Medicine

## 2021-05-14 ENCOUNTER — Encounter: Payer: Self-pay | Admitting: Family Medicine

## 2021-05-14 VITALS — BP 118/76 | HR 71 | Temp 98.1°F | Ht 65.5 in | Wt 174.0 lb

## 2021-05-14 DIAGNOSIS — K529 Noninfective gastroenteritis and colitis, unspecified: Secondary | ICD-10-CM | POA: Diagnosis not present

## 2021-05-14 DIAGNOSIS — Z0000 Encounter for general adult medical examination without abnormal findings: Secondary | ICD-10-CM

## 2021-05-14 NOTE — Patient Instructions (Addendum)
Start exercise regimen 3-5 days a week. Avoid fast food, greasy foods.  Start metamucil  daily.  Avoid greasy foods, caffeine. Work on stress reduction   Start FODMAP diet.  We will start referral to GI.

## 2021-05-14 NOTE — Progress Notes (Signed)
Patient ID: [name], female    DOB: 1981-07-18, 40 y.o.   MRN: [id number]  This visit was conducted in person.  BP 118/76   Pulse 71   Temp 98.1 F (36.7 C) (Temporal)   Ht 5' 5.5" (1.664 m)   Wt 174 lb (78.9 kg)   SpO2 98%   BMI 28.51 kg/m    CC: Chief Complaint  Patient presents with   Annual Exam    Subjective:   HPI: [name] is a 40 y.o. female presenting on 05/14/2021 for Annual Exam  Reviewed labs in detail.  She has been having BMs 4-5 times a day... ongoing for  several years.  Started after a GI bug 3-4 years ago. Varies from soft ot liquid. Occurs after each meal,urgent.  Has gas and explosive.  Has some  abd cramping that resolved with BM.  Has tried decreasing soda, caffeine. Worse with salad.  Tried probiotic without some relief.  No blood in stool.   No family history of bowel disorders.   [place] [facility] Visit from 05/14/2021 in [place] at [facility]  PHQ-2 Total Score 0       Diet: moderate, occ  exercise: no formal exercise.  The 10-year ASCVD risk score (Arnett DK, et al., 2019) is: 0.3%   Values used to calculate the score:     Age: 46 years     Sex: Female     Is Non-Hispanic African American: No     Diabetic: No     Tobacco smoker: No     Systolic Blood Pressure: 409 mmHg     Is BP treated: No     HDL Cholesterol: 51.9 mg/dL     Total Cholesterol: 138 mg/dL     Relevant past medical, surgical, family and social history reviewed and updated as indicated. Interim medical history since our last visit reviewed. Allergies and medications reviewed and updated. Outpatient Medications Prior to Visit  Medication Sig Dispense Refill   benzonatate (TESSALON) 100 MG capsule Take 1 capsule (100 mg total) by mouth 3 (three) times daily as needed for cough. 30 capsule 0   chlorpheniramine-HYDROcodone (TUSSIONEX PENNKINETIC ER) 10-8 MG/5ML SUER Take 5 mLs by mouth every 12 (twelve) hours as needed for cough. 115  mL 0   COVID-19 At Home Antigen Test (CARESTART COVID-19 HOME TEST) KIT Use as directed 4 each 0   COVID-19 At-Home Test KIT Use as directed 2 kit 0   Dapsone 5 % topical gel APPLY ON THE SKIN EVERY MORNING     montelukast (SINGULAIR) 10 MG tablet Take 1 tablet (10 mg total) by mouth at bedtime. (Patient not taking: Reported on 12/12/2020) 30 tablet 3   omeprazole (PRILOSEC) 40 MG capsule Take 1 capsule (40 mg total) by mouth daily. 30 capsule 0   No facility-administered medications prior to visit.     Per HPI unless specifically indicated in ROS section below Review of Systems  Constitutional:  Negative for fatigue and fever.  HENT:  Negative for congestion.   Eyes:  Negative for pain.  Respiratory:  Negative for cough and shortness of breath.   Cardiovascular:  Negative for chest pain, palpitations and leg swelling.  Gastrointestinal:  Positive for diarrhea. Negative for abdominal pain.  Genitourinary:  Negative for dysuria and vaginal bleeding.  Musculoskeletal:  Negative for back pain.  Neurological:  Negative for syncope, light-headedness and headaches.  Psychiatric/Behavioral:  Negative for dysphoric mood.   Objective:  BP 118/76  Pulse 71   Temp 98.1 F (36.7 C) (Temporal)   Ht 5' 5.5" (1.664 m)   Wt 174 lb (78.9 kg)   SpO2 98%   BMI 28.51 kg/m   Wt Readings from Last 3 Encounters:  05/14/21 174 lb (78.9 kg)  03/29/19 171 lb 1.9 oz (77.6 kg)  09/07/18 169 lb 8 oz (76.9 kg)      Physical Exam Vitals and nursing note reviewed.  Constitutional:      General: She is not in acute distress.    Appearance: Normal appearance. She is well-developed. She is not ill-appearing or toxic-appearing.  HENT:     Head: Normocephalic.     Right Ear: Hearing, tympanic membrane, ear canal and external ear normal.     Left Ear: Hearing, tympanic membrane, ear canal and external ear normal.     Nose: Nose normal.  Eyes:     General: Lids are normal. Lids are everted, no foreign  bodies appreciated.     Conjunctiva/sclera: Conjunctivae normal.     Pupils: Pupils are equal, round, and reactive to light.  Neck:     Thyroid: No thyroid mass or thyromegaly.     Vascular: No carotid bruit.     Trachea: Trachea normal.  Cardiovascular:     Rate and Rhythm: Normal rate and regular rhythm.     Heart sounds: Normal heart sounds, S1 normal and S2 normal. No murmur heard.   No gallop.  Pulmonary:     Effort: Pulmonary effort is normal. No respiratory distress.     Breath sounds: Normal breath sounds. No wheezing, rhonchi or rales.  Abdominal:     General: Bowel sounds are normal. There is no distension or abdominal bruit.     Palpations: Abdomen is soft. There is no fluid wave or mass.     Tenderness: There is no abdominal tenderness. There is no guarding or rebound.     Hernia: No hernia is present.  Musculoskeletal:     Cervical back: Normal range of motion and neck supple.  Lymphadenopathy:     Cervical: No cervical adenopathy.  Skin:    General: Skin is warm and dry.     Findings: No rash.  Neurological:     Mental Status: She is alert.     Cranial Nerves: No cranial nerve deficit.     Sensory: No sensory deficit.  Psychiatric:        Mood and Affect: Mood is not anxious or depressed.        Speech: Speech normal.        Behavior: Behavior normal. Behavior is cooperative.        Judgment: Judgment normal.      Results for orders placed or performed in visit on 05/11/21  Comprehensive metabolic panel  Result Value Ref Range   Sodium 136 135 - 145 mEq/L   Potassium 3.9 3.5 - 5.1 mEq/L   Chloride 102 96 - 112 mEq/L   CO2 27 19 - 32 mEq/L   Glucose, Bld 86 70 - 99 mg/dL   BUN 10 6 - 23 mg/dL   Creatinine, Ser 0.69 0.40 - 1.20 mg/dL   Total Bilirubin 0.5 0.2 - 1.2 mg/dL   Alkaline Phosphatase 33 (L) 39 - 117 U/L   AST 14 0 - 37 U/L   ALT 10 0 - 35 U/L   Total Protein 7.3 6.0 - 8.3 g/dL   Albumin 4.3 3.5 - 5.2 g/dL   GFR 107.73 >60.00 mL/min  Calcium  9.0 8.4 - 10.5 mg/dL  Lipid panel  Result Value Ref Range   Cholesterol 138 0 - 200 mg/dL   Triglycerides 44.0 0.0 - 149.0 mg/dL   HDL 51.90 >39.00 mg/dL   VLDL 8.8 0.0 - 40.0 mg/dL   LDL Cholesterol 77 0 - 99 mg/dL   Total CHOL/HDL Ratio 3    NonHDL 85.72     This visit occurred during the SARS-CoV-2 public health emergency.  Safety protocols were in place, including screening questions prior to the visit, additional usage of staff PPE, and extensive cleaning of exam room while observing appropriate contact time as indicated for disinfecting solutions.   COVID 19 screen:  No recent travel or known exposure to COVID19 The patient denies respiratory symptoms of COVID 19 at this time. The importance of social distancing was discussed today.   Assessment and Plan The patient's preventative maintenance and recommended screening tests for an annual wellness exam were reviewed in full today. Brought up to date unless services declined.  Counselled on the importance of diet, exercise, and its role in overall health and mortality. The patient's FH and SH was reviewed, including their home life, tobacco status, and drug and alcohol status.   Vaccines: Uptodate tdap. COVID x 4 , flu  in 05/2021 Pap/DVE:  per Dr. Pamala Hurry Mammo:   2021 , no early family history of breast cancer except Maternal aunt age 45. Colon:  No early family history of colon cancer Smoking Status: none ETOH/ drug KGM:[id number]/VOZD  HIV screen:   done in past.  Hep C done   Problem List Items Addressed This Visit     Chronic diarrhea    IBS vs IBD vs other.  Start metamucil  daily.  Avoid greasy foods, caffeine. Work on stress reduction   Start FODMAP diet.  We will start referral to GI.      Relevant Orders   Ambulatory referral to Gastroenterology   Other Visit Diagnoses     Routine general medical examination at a health care facility    -  Primary        Eliezer Lofts, MD

## 2021-05-20 ENCOUNTER — Encounter: Payer: Self-pay | Admitting: Obstetrics

## 2021-05-24 ENCOUNTER — Encounter: Payer: Self-pay | Admitting: Obstetrics

## 2021-06-02 ENCOUNTER — Encounter: Payer: Self-pay | Admitting: *Deleted

## 2021-06-09 ENCOUNTER — Other Ambulatory Visit (HOSPITAL_COMMUNITY): Payer: Self-pay

## 2021-06-09 MED ORDER — CARESTART COVID-19 HOME TEST VI KIT
PACK | 0 refills | Status: DC
  Filled 2021-06-09: qty 2, 2d supply, fill #0

## 2021-06-14 DIAGNOSIS — K529 Noninfective gastroenteritis and colitis, unspecified: Secondary | ICD-10-CM | POA: Insufficient documentation

## 2021-06-14 NOTE — Assessment & Plan Note (Signed)
IBS vs IBD vs other.  Start metamucil  daily.  Avoid greasy foods, caffeine. Work on stress reduction   Start FODMAP diet.  We will start referral to GI.

## 2021-07-02 ENCOUNTER — Other Ambulatory Visit (HOSPITAL_COMMUNITY): Payer: Self-pay

## 2021-07-02 MED ORDER — CARESTART COVID-19 HOME TEST VI KIT
PACK | 0 refills | Status: DC
Start: 2021-07-02 — End: 2021-08-23
  Filled 2021-07-02: qty 4, 4d supply, fill #0

## 2021-08-13 ENCOUNTER — Encounter: Payer: Self-pay | Admitting: Family Medicine

## 2021-08-13 DIAGNOSIS — Z319 Encounter for procreative management, unspecified: Secondary | ICD-10-CM

## 2021-08-23 ENCOUNTER — Other Ambulatory Visit (HOSPITAL_COMMUNITY): Payer: Self-pay

## 2021-08-23 MED ORDER — CARESTART COVID-19 HOME TEST VI KIT
PACK | 0 refills | Status: DC
  Filled 2021-08-23: qty 4, 4d supply, fill #0

## 2021-08-25 ENCOUNTER — Other Ambulatory Visit (HOSPITAL_COMMUNITY): Payer: Self-pay

## 2021-08-25 MED ORDER — "BD LUER-LOK SYRINGE 18G X 1-1/2"" 3 ML MISC"
3 refills | Status: DC
  Filled 2021-08-25: qty 5, 5d supply, fill #0
  Filled 2021-08-25: qty 25, 25d supply, fill #0
  Filled 2021-11-04: qty 30, 30d supply, fill #1

## 2021-08-25 MED ORDER — ESTRADIOL 0.1 MG/24HR TD PTTW
1.0000 | MEDICATED_PATCH | TRANSDERMAL | 4 refills | Status: DC
  Filled 2021-08-25: qty 8, 28d supply, fill #0
  Filled 2021-10-19: qty 8, 28d supply, fill #1
  Filled 2021-11-08 – 2021-11-11 (×2): qty 8, 28d supply, fill #2

## 2021-08-25 MED ORDER — ESTRADIOL 2 MG PO TABS
2.0000 mg | ORAL_TABLET | Freq: Two times a day (BID) | ORAL | 3 refills | Status: DC
  Filled 2021-08-25: qty 60, 30d supply, fill #0
  Filled 2021-10-19: qty 60, 30d supply, fill #1
  Filled 2021-11-08 – 2021-11-12 (×2): qty 60, 30d supply, fill #2

## 2021-08-25 MED ORDER — METHYLPREDNISOLONE 8 MG PO TABS
8.0000 mg | ORAL_TABLET | Freq: Two times a day (BID) | ORAL | 0 refills | Status: DC
  Filled 2021-08-25: qty 8, 4d supply, fill #0

## 2021-08-25 MED ORDER — "BD HYPODERMIC NEEDLE 22G X 1"" MISC"
3 refills | Status: DC
  Filled 2021-08-25: qty 30, 30d supply, fill #0
  Filled 2021-11-04: qty 30, 30d supply, fill #1

## 2021-08-25 MED ORDER — PROGESTERONE 50 MG/ML IM OIL
50.0000 mg | TOPICAL_OIL | Freq: Every day | INTRAMUSCULAR | 3 refills | Status: DC
  Filled 2021-08-25: qty 30, 30d supply, fill #0
  Filled 2021-11-04: qty 30, 30d supply, fill #1

## 2021-08-26 ENCOUNTER — Other Ambulatory Visit (HOSPITAL_COMMUNITY): Payer: Self-pay

## 2021-09-15 ENCOUNTER — Other Ambulatory Visit (HOSPITAL_COMMUNITY): Payer: Self-pay

## 2021-09-15 MED ORDER — DOXYCYCLINE HYCLATE 100 MG PO CAPS
100.0000 mg | ORAL_CAPSULE | Freq: Two times a day (BID) | ORAL | 0 refills | Status: AC
  Filled 2021-09-15: qty 10, 5d supply, fill #0

## 2021-10-13 ENCOUNTER — Other Ambulatory Visit (HOSPITAL_COMMUNITY): Payer: Self-pay

## 2021-10-13 MED ORDER — PENTOXIFYLLINE ER 400 MG PO TBCR
400.0000 mg | EXTENDED_RELEASE_TABLET | Freq: Three times a day (TID) | ORAL | 0 refills | Status: DC
  Filled 2021-10-13: qty 24, 8d supply, fill #0

## 2021-10-13 MED ORDER — VITAMIN E 180 MG (400 UNIT) PO CAPS: 400.0000 [IU] | ORAL_CAPSULE | Freq: Every day | ORAL | 1 refills | Status: DC

## 2021-10-19 ENCOUNTER — Other Ambulatory Visit (HOSPITAL_COMMUNITY): Payer: Self-pay

## 2021-10-19 MED ORDER — VIRT-C DHA 53.5-38-1 MG PO CAPS
1.0000 | ORAL_CAPSULE | Freq: Every day | ORAL | 6 refills | Status: DC
  Filled 2021-10-19: qty 30, 30d supply, fill #0

## 2021-10-20 ENCOUNTER — Other Ambulatory Visit (HOSPITAL_COMMUNITY): Payer: Self-pay

## 2021-11-04 ENCOUNTER — Other Ambulatory Visit (HOSPITAL_COMMUNITY): Payer: Self-pay

## 2021-11-04 MED ORDER — CARESTART COVID-19 HOME TEST VI KIT
PACK | 0 refills | Status: DC
  Filled 2021-11-04: qty 4, 8d supply, fill #0

## 2021-11-04 MED ORDER — NEEDLE (DISP) 23G X 1" MISC
3 refills | Status: DC
Start: 2021-08-25 — End: 2022-05-17
  Filled 2021-11-04: qty 30, 30d supply, fill #0

## 2021-11-08 ENCOUNTER — Other Ambulatory Visit (HOSPITAL_COMMUNITY): Payer: Self-pay

## 2021-11-11 ENCOUNTER — Other Ambulatory Visit (HOSPITAL_COMMUNITY): Payer: Self-pay

## 2021-11-11 MED ORDER — FOLIC ACID 1 MG PO TABS
1.0000 mg | ORAL_TABLET | Freq: Every day | ORAL | 4 refills | Status: DC
  Filled 2021-11-11: qty 30, 30d supply, fill #0

## 2021-11-12 ENCOUNTER — Other Ambulatory Visit (HOSPITAL_COMMUNITY): Payer: Self-pay

## 2021-12-07 ENCOUNTER — Other Ambulatory Visit (HOSPITAL_COMMUNITY): Payer: Self-pay

## 2021-12-07 MED ORDER — RHOGAM ULTRA-FILTERED PLUS 1500 UNITS IM SOSY
PREFILLED_SYRINGE | INTRAMUSCULAR | 0 refills | Status: DC
  Filled 2021-12-07 – 2021-12-09 (×2): qty 1, 30d supply, fill #0

## 2021-12-07 MED ORDER — MISOPROSTOL 200 MCG PO TABS
800.0000 ug | ORAL_TABLET | Freq: Once | ORAL | 1 refills | Status: DC
  Filled 2021-12-07: qty 4, 1d supply, fill #0

## 2021-12-09 ENCOUNTER — Other Ambulatory Visit (HOSPITAL_COMMUNITY): Payer: Self-pay

## 2022-01-10 ENCOUNTER — Telehealth: Payer: No Typology Code available for payment source | Admitting: Physician Assistant

## 2022-01-10 ENCOUNTER — Encounter (INDEPENDENT_AMBULATORY_CARE_PROVIDER_SITE_OTHER): Payer: No Typology Code available for payment source | Admitting: Family Medicine

## 2022-01-10 DIAGNOSIS — J020 Streptococcal pharyngitis: Secondary | ICD-10-CM

## 2022-01-10 DIAGNOSIS — R070 Pain in throat: Secondary | ICD-10-CM | POA: Diagnosis not present

## 2022-01-10 DIAGNOSIS — R509 Fever, unspecified: Secondary | ICD-10-CM

## 2022-01-10 NOTE — Progress Notes (Signed)
[place] 

## 2022-01-11 MED ORDER — AMOXICILLIN 500 MG PO CAPS: 1000.0000 mg | ORAL_CAPSULE | Freq: Two times a day (BID) | ORAL | 0 refills | Status: DC

## 2022-01-11 NOTE — Telephone Encounter (Signed)
Please see the MyChart message reply(ies) for my assessment and plan.  The patient gave consent for this Medical Advice Message and is aware that it may result in a bill to their insurance company as well as the possibility that this may result in a co-payment or deductible. They are an established patient, but are not seeking medical advice exclusively about a problem treated during an in person or video visit in the last 7 days. I did not recommend an in person or video visit within 7 days of my reply.  I spent a total of 10 minutes cumulative time within 7 days through MyChart messaging Seth Friedlander, MD  

## 2022-01-20 ENCOUNTER — Other Ambulatory Visit (HOSPITAL_COMMUNITY): Payer: Self-pay

## 2022-01-20 ENCOUNTER — Ambulatory Visit (INDEPENDENT_AMBULATORY_CARE_PROVIDER_SITE_OTHER): Payer: No Typology Code available for payment source | Admitting: Family Medicine

## 2022-01-20 ENCOUNTER — Encounter: Payer: Self-pay | Admitting: Family Medicine

## 2022-01-20 VITALS — BP 132/74 | HR 92 | Temp 98.0°F | Ht 65.0 in | Wt 172.8 lb

## 2022-01-20 DIAGNOSIS — R058 Other specified cough: Secondary | ICD-10-CM | POA: Diagnosis not present

## 2022-01-20 MED ORDER — PREDNISONE 20 MG PO TABS
20.0000 mg | ORAL_TABLET | Freq: Two times a day (BID) | ORAL | 0 refills | Status: AC
  Filled 2022-01-20: qty 14, 7d supply, fill #0

## 2022-01-20 MED ORDER — HYDROCODONE BIT-HOMATROP MBR 5-1.5 MG/5ML PO SOLN
ORAL | 0 refills | Status: DC
  Filled 2022-01-20: qty 60, 12d supply, fill #0

## 2022-01-20 NOTE — Progress Notes (Signed)
Established Patient Office Visit  Subjective   Patient ID: [name], female    DOB: 1980-08-24  Age: 41 y.o. MRN: [id number]  Chief Complaint  Patient presents with   Cough    Cough, sore throat, congestion cough keeping patient up symptoms x 2 week.     Cough Pertinent negatives include no chest pain, chills, myalgias, shortness of breath, weight loss or wheezing.   for follow-up of a 2-week history of URI signs and symptoms that have mostly resolved save lingering dry cough associated with tightness in the chest.  Cough is worse at night.  There is no overt wheezing.  She is status post nasal congestion, postnasal drip sore throat with tender anterior cervical adenopathy.  The symptoms are mostly resolved.    Review of Systems  Constitutional:  Negative for chills, diaphoresis, malaise/fatigue and weight loss.  HENT: Negative.    Eyes: Negative.  Negative for blurred vision and double vision.  Respiratory:  Positive for cough. Negative for sputum production, shortness of breath and wheezing.   Cardiovascular:  Negative for chest pain.  Gastrointestinal:  Negative for abdominal pain, nausea and vomiting.  Genitourinary: Negative.   Musculoskeletal:  Negative for falls, joint pain and myalgias.  Neurological:  Negative for speech change, loss of consciousness and weakness.  Psychiatric/Behavioral: Negative.        Objective:     BP 132/74 (BP Location: Right Arm, Patient Position: Sitting, Cuff Size: Large)   Pulse 92   Temp 98 F (36.7 C) (Temporal)   Ht '5\' 5"'$  (1.651 m)   Wt 172 lb 12.8 oz (78.4 kg)   SpO2 97%   BMI 28.76 kg/m    Physical Exam Constitutional:      General: She is not in acute distress.    Appearance: Normal appearance. She is not ill-appearing, toxic-appearing or diaphoretic.  HENT:     Head: Normocephalic and atraumatic.     Right Ear: Tympanic membrane, ear canal and external ear normal.     Left Ear: Tympanic membrane, ear canal and  external ear normal.     Mouth/Throat:     Mouth: Mucous membranes are moist.     Pharynx: Oropharynx is clear. No oropharyngeal exudate or posterior oropharyngeal erythema.  Eyes:     General: No scleral icterus.       Right eye: No discharge.        Left eye: No discharge.     Extraocular Movements: Extraocular movements intact.     Conjunctiva/sclera: Conjunctivae normal.     Pupils: Pupils are equal, round, and reactive to light.  Cardiovascular:     Rate and Rhythm: Normal rate and regular rhythm.  Pulmonary:     Effort: Pulmonary effort is normal. No respiratory distress.     Breath sounds: Normal breath sounds. No wheezing or rales.  Abdominal:     General: Bowel sounds are normal.     Tenderness: There is no abdominal tenderness. There is no guarding.  Musculoskeletal:     Cervical back: No rigidity or tenderness.  Lymphadenopathy:     Cervical: Cervical adenopathy (shoddy anterior) present.  Skin:    General: Skin is warm and dry.  Neurological:     Mental Status: She is alert and oriented to person, place, and time.  Psychiatric:        Mood and Affect: Mood normal.        Behavior: Behavior normal.      No results found for any  visits on 01/20/22.    The 10-year ASCVD risk score (Arnett DK, et al., 2019) is: 0.4%    Assessment & Plan:   Problem List Items Addressed This Visit       Respiratory   Post-viral cough syndrome - Primary   Relevant Medications   predniSONE (DELTASONE) 20 MG tablet   HYDROcodone bit-homatropine (HYDROMET) 5-1.5 MG/5ML syrup    Return if symptoms worsen or fail to improve.    Libby Maw, MD

## 2022-05-06 ENCOUNTER — Telehealth: Payer: Self-pay | Admitting: Family Medicine

## 2022-05-06 DIAGNOSIS — Z13220 Encounter for screening for lipoid disorders: Secondary | ICD-10-CM

## 2022-05-06 NOTE — Telephone Encounter (Signed)
-----   Message from Ellamae Sia sent at 04/27/2022  3:21 PM EDT ----- Regarding: Lab orders forTuesday, 10.3.23 Patient is scheduled for CPX labs, please order future labs, Thanks , Karna Christmas

## 2022-05-09 ENCOUNTER — Telehealth: Payer: Self-pay | Admitting: Family Medicine

## 2022-05-09 DIAGNOSIS — Z13220 Encounter for screening for lipoid disorders: Secondary | ICD-10-CM

## 2022-05-09 NOTE — Telephone Encounter (Signed)
Pt requested for cpe lab work to be sent to [facility] on [address] in [place] since she doesn't live near [place]. Call back # [phone].

## 2022-05-10 ENCOUNTER — Other Ambulatory Visit: Payer: No Typology Code available for payment source

## 2022-05-10 NOTE — Telephone Encounter (Signed)
I reentered the labs for her physical with the resulting agency as [facility].  Does not work or do I need to do something differently?

## 2022-05-12 NOTE — Telephone Encounter (Signed)
[name] has taken care of everything.

## 2022-05-17 ENCOUNTER — Ambulatory Visit (INDEPENDENT_AMBULATORY_CARE_PROVIDER_SITE_OTHER): Payer: No Typology Code available for payment source | Admitting: Family Medicine

## 2022-05-17 ENCOUNTER — Encounter: Payer: Self-pay | Admitting: Family Medicine

## 2022-05-17 VITALS — BP 102/66 | HR 70 | Temp 98.6°F | Ht 65.5 in | Wt 174.5 lb

## 2022-05-17 DIAGNOSIS — Z0000 Encounter for general adult medical examination without abnormal findings: Secondary | ICD-10-CM

## 2022-05-17 DIAGNOSIS — K529 Noninfective gastroenteritis and colitis, unspecified: Secondary | ICD-10-CM

## 2022-05-17 DIAGNOSIS — Z13220 Encounter for screening for lipoid disorders: Secondary | ICD-10-CM | POA: Diagnosis not present

## 2022-05-17 NOTE — Patient Instructions (Signed)
Please stop at the lab to have labs drawn.  

## 2022-05-17 NOTE — Progress Notes (Signed)
Patient ID: [name], female    DOB: [birth year], 41 y.o.   MRN: [id number]  This visit was conducted in person.  BP 102/66   Pulse 70   Temp 98.6 F (37 C) (Oral)   Ht 5' 5.5" (1.664 m)   Wt 174 lb 8 oz (79.2 kg)   SpO2 97%   BMI 28.60 kg/m    CC:  Chief Complaint  Patient presents with   Annual Exam    Subjective:   HPI: [name] is a 41 y.o. female presenting on 05/17/2022 for Annual Exam   Due for yearly labs to check cholesterol.  Doing well overall.  Diet: good, heart healthy Exercise: Has puppy daily walking Body mass index is 28.6 kg/m.    Continuing to have looser and frequent stool... seen by Dr. Michail Sermon last year as he was first available. Had negative celiac test.  Told to take fiber... it did not help.  No abdominal , no fever.   She would like to see a different GI MD at [facility] as she was uncomfortable with Dr. Michail Sermon.     Ar[facility] Visit from 05/17/2022 in [place] at [facility]  PHQ-2 Total Score 0        Relevant past medical, surgical, family and social history reviewed and updated as indicated. Interim medical history since our last visit reviewed. Allergies and medications reviewed and updated. Outpatient Medications Prior to Visit  Medication Sig Dispense Refill   levonorgestrel (LILETTA, 52 MG,) 20.1 MCG/DAY IUD IUD Take 1 device by intrauterine route.     amoxicillin (AMOXIL) 500 MG capsule Take 2 capsules (1,000 mg total) by mouth 2 (two) times daily. 40 capsule 0   estradiol (ESTRACE) 2 MG tablet Take 1 tablet (2 mg total) by mouth 2 (two) times daily. (Patient not taking: Reported on 01/20/2022) 60 tablet 3   estradiol (VIVELLE-DOT) 0.1 MG/24HR patch Place 1 patch (0.1 mg total) onto the skin every 3 (three) days as directed (Patient not taking: Reported on 2/99/2426) 8 patch 4   folic acid (FOLVITE) 1 MG tablet Take 1 tablet (1 mg total) by mouth daily. (Patient not taking: Reported on  01/20/2022) 30 tablet 4   HYDROcodone bit-homatropine (HYDROMET) 5-1.5 MG/5ML syrup May take one teaspoon (5 mL) before bed as needed for cough. 60 mL 0   misoprostol (CYTOTEC) 200 MCG tablet Insert 4 tablets (800 mcg total) vaginally at once 4 tablet 1   NEEDLE, DISP, 23 G 23G X 1" MISC Use to inject progesterone (Patient not taking: Reported on 01/20/2022) 30 each 3   pentoxifylline (TRENTAL) 400 MG CR tablet Take 1 tablet (400 mg total) by mouth 3 (three) times daily. (Patient not taking: Reported on 01/20/2022) 24 tablet 0   Prenat-FeFum-FePo-FA-Omega 3 (VIRT-C DHA) 53.5-38-1 MG CAPS Take 1 capsule by mouth daily. (Patient not taking: Reported on 01/20/2022) 30 capsule 6   progesterone 50 MG/ML injection Inject 1 mL (50 mg total) into the muscle daily as directed (Patient not taking: Reported on 01/20/2022) 30 mL 3   Rho D Immune Globulin (RHOGAM ULTRA-FILTERED PLUS) 1500 units SOSY Inject IM x 1 dose as directed (Patient not taking: Reported on 01/20/2022) 1 each 0   SYRINGE-NEEDLE, DISP, 3 ML (B-D 3CC LUER-LOK SYR 18GX1-1/2) 18G X 1-1/2" 3 ML MISC use with progesterone (Patient not taking: Reported on 01/20/2022) 30 each 3   vitamin E 180 MG (400 UNITS) capsule Take 1 capsule (400 Units total) by  mouth daily. (Patient not taking: Reported on 01/20/2022) 30 capsule 1   No facility-administered medications prior to visit.     Per HPI unless specifically indicated in ROS section below Review of Systems  Constitutional:  Negative for fatigue and fever.  HENT:  Negative for congestion.   Eyes:  Negative for pain.  Respiratory:  Negative for cough and shortness of breath.   Cardiovascular:  Negative for chest pain, palpitations and leg swelling.  Gastrointestinal:  Negative for abdominal pain.  Genitourinary:  Negative for dysuria and vaginal bleeding.  Musculoskeletal:  Negative for back pain.  Neurological:  Negative for syncope, light-headedness and headaches.  Psychiatric/Behavioral:  Negative  for dysphoric mood.    Objective:  BP 102/66   Pulse 70   Temp 98.6 F (37 C) (Oral)   Ht 5' 5.5" (1.664 m)   Wt 174 lb 8 oz (79.2 kg)   SpO2 97%   BMI 28.60 kg/m   Wt Readings from Last 3 Encounters:  05/17/22 174 lb 8 oz (79.2 kg)  01/20/22 172 lb 12.8 oz (78.4 kg)  05/14/21 174 lb (78.9 kg)      Physical Exam Vitals and nursing note reviewed.  Constitutional:      General: She is not in acute distress.    Appearance: Normal appearance. She is well-developed. She is not ill-appearing or toxic-appearing.  HENT:     Head: Normocephalic.     Right Ear: Hearing, tympanic membrane, ear canal and external ear normal.     Left Ear: Hearing, tympanic membrane, ear canal and external ear normal.     Nose: Nose normal.  Eyes:     General: Lids are normal. Lids are everted, no foreign bodies appreciated.     Conjunctiva/sclera: Conjunctivae normal.     Pupils: Pupils are equal, round, and reactive to light.  Neck:     Thyroid: No thyroid mass or thyromegaly.     Vascular: No carotid bruit.     Trachea: Trachea normal.  Cardiovascular:     Rate and Rhythm: Normal rate and regular rhythm.     Heart sounds: Normal heart sounds, S1 normal and S2 normal. No murmur heard.    No gallop.  Pulmonary:     Effort: Pulmonary effort is normal. No respiratory distress.     Breath sounds: Normal breath sounds. No wheezing, rhonchi or rales.  Abdominal:     General: Bowel sounds are normal. There is no distension or abdominal bruit.     Palpations: Abdomen is soft. There is no fluid wave or mass.     Tenderness: There is no abdominal tenderness. There is no guarding or rebound.     Hernia: No hernia is present.  Musculoskeletal:     Cervical back: Normal range of motion and neck supple.  Lymphadenopathy:     Cervical: No cervical adenopathy.  Skin:    General: Skin is warm and dry.     Findings: No rash.  Neurological:     Mental Status: She is alert.     Cranial Nerves: No cranial  nerve deficit.     Sensory: No sensory deficit.  Psychiatric:        Mood and Affect: Mood is not anxious or depressed.        Speech: Speech normal.        Behavior: Behavior normal. Behavior is cooperative.        Judgment: Judgment normal.       Results for orders placed or performed  in visit on 05/24/21  HM MAMMOGRAPHY  Result Value Ref Range   HM Mammogram 0-4 Bi-Rad 0-4 Bi-Rad, Self Reported Normal     COVID 19 screen:  No recent travel or known exposure to [place] The patient denies respiratory symptoms of COVID 19 at this time. The importance of social distancing was discussed today.   Assessment and Plan The patient's preventative maintenance and recommended screening tests for an annual wellness exam were reviewed in full today. Brought up to date unless services declined.  Counselled on the importance of diet, exercise, and its role in overall health and mortality. The patient's FH and SH was reviewed, including their home life, tobacco status, and drug and alcohol status.    Vaccines: Uptodate tdap. COVID x 4 , flu  in 05/2021 Pap/DVE:  per Dr. Pamala Hurry  She had a miscarriage in May... waiting 6 month before mammogram per GYN. Mammo:   2022, repeat due,  no early family history of breast cancer except Maternal aunt age 76. Colon:  No early family history of colon cancer Smoking Status: none ETOH/ drug YJE:HUDJ/SHFW  HIV screen:   done in past.  Hep C done  Problem List Items Addressed This Visit     Chronic diarrhea   Relevant Orders   Ambulatory referral to Gastroenterology   Other Visit Diagnoses     Routine general medical examination at a health care facility    -  Primary        Eliezer Lofts, MD

## 2022-05-17 NOTE — Addendum Note (Signed)
Addended by: Ellamae Sia on: 05/17/2022 10:08 AM   Modules accepted: Orders

## 2022-05-18 LAB — COMPREHENSIVE METABOLIC PANEL
ALT: 11 IU/L (ref 0–32)
AST: 14 IU/L (ref 0–40)
Albumin/Globulin Ratio: 1.7 (ref 1.2–2.2)
Albumin: 4.8 g/dL (ref 3.9–4.9)
Alkaline Phosphatase: 46 IU/L (ref 44–121)
BUN/Creatinine Ratio: 15 (ref 9–23)
BUN: 11 mg/dL (ref 6–24)
Bilirubin Total: 0.5 mg/dL (ref 0.0–1.2)
CO2: 18 mmol/L — ABNORMAL LOW (ref 20–29)
Calcium: 9.3 mg/dL (ref 8.7–10.2)
Chloride: 100 mmol/L (ref 96–106)
Creatinine, Ser: 0.71 mg/dL (ref 0.57–1.00)
Globulin, Total: 2.9 g/dL (ref 1.5–4.5)
Glucose: 62 mg/dL — ABNORMAL LOW (ref 70–99)
Potassium: 4.2 mmol/L (ref 3.5–5.2)
Sodium: 138 mmol/L (ref 134–144)
Total Protein: 7.7 g/dL (ref 6.0–8.5)
eGFR: 109 mL/min/{1.73_m2} (ref >59)

## 2022-05-18 LAB — LIPID PANEL
Chol/HDL Ratio: 2.9 ratio (ref 0.0–4.4)
Cholesterol, Total: 155 mg/dL (ref 100–199)
HDL: 53 mg/dL (ref >39)
LDL Chol Calc (NIH): 91 mg/dL (ref 0–99)
Triglycerides: 51 mg/dL (ref 0–149)
VLDL Cholesterol Cal: 11 mg/dL (ref 5–40)

## 2022-05-18 NOTE — Progress Notes (Signed)
No critical labs need to be addressed urgently. We will discuss labs in detail at upcoming office visit.   

## 2022-07-28 ENCOUNTER — Telehealth: Payer: No Typology Code available for payment source | Admitting: Emergency Medicine

## 2022-07-28 ENCOUNTER — Other Ambulatory Visit (HOSPITAL_COMMUNITY): Payer: Self-pay

## 2022-07-28 DIAGNOSIS — Z20828 Contact with and (suspected) exposure to other viral communicable diseases: Secondary | ICD-10-CM

## 2022-07-28 DIAGNOSIS — J111 Influenza due to unidentified influenza virus with other respiratory manifestations: Secondary | ICD-10-CM | POA: Diagnosis not present

## 2022-07-28 MED ORDER — OSELTAMIVIR PHOSPHATE 75 MG PO CAPS
75.0000 mg | ORAL_CAPSULE | Freq: Two times a day (BID) | ORAL | 0 refills | Status: DC
  Filled 2022-07-28: qty 10, 5d supply, fill #0

## 2022-07-28 NOTE — Progress Notes (Signed)
E visit for Flu like symptoms   We are sorry that you are not feeling well.  Here is how we plan to help! Based on what you have shared with me it looks like you may have a respiratory virus that may be influenza.  Influenza or "the flu" is   an infection caused by a respiratory virus. The flu virus is highly contagious and persons who did not receive their yearly flu vaccination may "catch" the flu from close contact.  We have anti-viral medications to treat the viruses that cause this infection. They are not a "cure" and only shorten the course of the infection. These prescriptions are most effective when they are given within the first 2 days of "flu" symptoms. Antiviral medication are indicated if you have a high risk of complications from the flu. You should  also consider an antiviral medication if you are in close contact with someone who is at risk. These medications can help patients avoid complications from the flu  but have side effects that you should know. Possible side effects from Tamiflu or oseltamivir include nausea, vomiting, diarrhea, dizziness, headaches, eye redness, sleep problems or other respiratory symptoms. You should not take Tamiflu if you have an allergy to oseltamivir or any to the ingredients in Tamiflu.  Based upon your symptoms and potential risk factors I have prescribed Oseltamivir (Tamiflu).  It has been sent to your designated pharmacy.  You will take one 75 mg capsule orally twice a day for the next 5 days.  ANYONE WHO HAS FLU SYMPTOMS SHOULD: Stay home. The flu is highly contagious and going out or to work exposes others! Be sure to drink plenty of fluids. Water is fine as well as fruit juices, sodas and electrolyte beverages. You may want to stay away from caffeine or alcohol. If you are nauseated, try taking small sips of liquids. How do you know if you are getting enough fluid? Your urine should be a pale yellow or almost colorless. Get rest. Taking a steamy  shower or using a humidifier may help nasal congestion and ease sore throat pain. Using a saline nasal spray works much the same way. Cough drops, hard candies and sore throat lozenges may ease your cough. Line up a caregiver. Have someone check on you regularly.   GET HELP RIGHT AWAY IF: You cannot keep down liquids or your medications. You become short of breath Your fell like you are going to pass out or loose consciousness. Your symptoms persist after you have completed your treatment plan MAKE SURE YOU  Understand these instructions. Will watch your condition. Will get help right away if you are not doing well or get worse.  Your e-visit answers were reviewed by a board certified advanced clinical practitioner to complete your personal care plan.  Depending on the condition, your plan could have included both over the counter or prescription medications.  If there is a problem please reply  once you have received a response from your provider.  Your safety is important to us.  If you have drug allergies check your prescription carefully.    You can use MyChart to ask questions about today's visit, request a non-urgent call back, or ask for a work or school excuse for 24 hours related to this e-Visit. If it has been greater than 24 hours you will need to follow up with your provider, or enter a new e-Visit to address those concerns.  You will get an e-mail in the next   two days asking about your experience.  I hope that your e-visit has been valuable and will speed your recovery. Thank you for using e-visits.  I have spent 5 minutes in review of e-visit questionnaire, review and updating patient chart, medical decision making and response to patient.   Andria Head, PhD, FNP-BC   

## 2022-08-04 LAB — HM MAMMOGRAPHY

## 2022-12-25 ENCOUNTER — Telehealth: Payer: 59 | Admitting: Nurse Practitioner

## 2022-12-25 DIAGNOSIS — J329 Chronic sinusitis, unspecified: Secondary | ICD-10-CM

## 2022-12-25 DIAGNOSIS — B9689 Other specified bacterial agents as the cause of diseases classified elsewhere: Secondary | ICD-10-CM

## 2022-12-25 MED ORDER — FLUTICASONE PROPIONATE 50 MCG/ACT NA SUSP
2.0000 | Freq: Every day | NASAL | 0 refills | Status: DC
Start: 2022-12-25 — End: 2023-01-17

## 2022-12-25 MED ORDER — PSEUDOEPH-BROMPHEN-DM 30-2-10 MG/5ML PO SYRP
5.0000 mL | ORAL_SOLUTION | Freq: Four times a day (QID) | ORAL | 0 refills | Status: DC | PRN
Start: 2022-12-25 — End: 2023-06-22

## 2022-12-25 MED ORDER — AMOXICILLIN-POT CLAVULANATE 875-125 MG PO TABS
1.0000 | ORAL_TABLET | Freq: Two times a day (BID) | ORAL | 0 refills | Status: AC
Start: 2022-12-25 — End: 2023-01-01

## 2022-12-25 NOTE — Progress Notes (Signed)

## 2022-12-25 NOTE — Progress Notes (Signed)
I have spent 5 minutes in review of e-visit questionnaire, review and updating patient chart, medical decision making and response to patient.  ° °[name] W Errika Narvaiz, NP ° °  °

## 2023-01-17 ENCOUNTER — Other Ambulatory Visit: Payer: Self-pay | Admitting: Nurse Practitioner

## 2023-01-17 DIAGNOSIS — B9689 Other specified bacterial agents as the cause of diseases classified elsewhere: Secondary | ICD-10-CM

## 2023-02-01 ENCOUNTER — Other Ambulatory Visit (HOSPITAL_COMMUNITY): Payer: Self-pay

## 2023-02-01 DIAGNOSIS — Z01419 Encounter for gynecological examination (general) (routine) without abnormal findings: Secondary | ICD-10-CM | POA: Diagnosis not present

## 2023-02-01 MED ORDER — CLOBETASOL PROPIONATE 0.05 % EX OINT
TOPICAL_OINTMENT | Freq: Two times a day (BID) | CUTANEOUS | 0 refills | Status: AC
  Filled 2023-02-01: qty 30, 30d supply, fill #0

## 2023-02-13 ENCOUNTER — Other Ambulatory Visit (HOSPITAL_COMMUNITY): Payer: Self-pay

## 2023-02-16 DIAGNOSIS — H40013 Open angle with borderline findings, low risk, bilateral: Secondary | ICD-10-CM | POA: Diagnosis not present

## 2023-02-16 DIAGNOSIS — H5213 Myopia, bilateral: Secondary | ICD-10-CM | POA: Diagnosis not present

## 2023-04-12 ENCOUNTER — Encounter: Payer: Self-pay | Admitting: Family Medicine

## 2023-04-12 DIAGNOSIS — L659 Nonscarring hair loss, unspecified: Secondary | ICD-10-CM

## 2023-04-12 DIAGNOSIS — Z13220 Encounter for screening for lipoid disorders: Secondary | ICD-10-CM

## 2023-04-26 ENCOUNTER — Encounter: Payer: Self-pay | Admitting: *Deleted

## 2023-04-26 NOTE — Telephone Encounter (Signed)
-----   Message from Alvina Chou sent at 04/25/2023  3:15 PM EDT ----- Regarding: Lab orders for Fri, 10.4.24 Patient is scheduled for CPX labs, please order future labs, Thanks , Camelia Eng

## 2023-04-26 NOTE — Telephone Encounter (Signed)
This encounter was created in error - please disregard.

## 2023-05-11 ENCOUNTER — Other Ambulatory Visit (INDEPENDENT_AMBULATORY_CARE_PROVIDER_SITE_OTHER): Payer: 59

## 2023-05-11 DIAGNOSIS — Z13220 Encounter for screening for lipoid disorders: Secondary | ICD-10-CM | POA: Diagnosis not present

## 2023-05-11 DIAGNOSIS — L659 Nonscarring hair loss, unspecified: Secondary | ICD-10-CM

## 2023-05-12 ENCOUNTER — Other Ambulatory Visit: Payer: 59

## 2023-05-12 LAB — COMPREHENSIVE METABOLIC PANEL
ALT: 12 [IU]/L (ref 0–32)
AST: 13 [IU]/L (ref 0–40)
Albumin: 4.3 g/dL (ref 3.9–4.9)
Alkaline Phosphatase: 38 [IU]/L — ABNORMAL LOW (ref 44–121)
BUN/Creatinine Ratio: 15 (ref 9–23)
BUN: 11 mg/dL (ref 6–24)
Bilirubin Total: 0.6 mg/dL (ref 0.0–1.2)
CO2: 25 mmol/L (ref 20–29)
Calcium: 9.2 mg/dL (ref 8.7–10.2)
Chloride: 101 mmol/L (ref 96–106)
Creatinine, Ser: 0.72 mg/dL (ref 0.57–1.00)
Globulin, Total: 2.9 g/dL (ref 1.5–4.5)
Glucose: 82 mg/dL (ref 70–99)
Potassium: 4.2 mmol/L (ref 3.5–5.2)
Sodium: 137 mmol/L (ref 134–144)
Total Protein: 7.2 g/dL (ref 6.0–8.5)
eGFR: 106 mL/min/{1.73_m2} (ref >59)

## 2023-05-12 LAB — LIPID PANEL
Chol/HDL Ratio: 2.8 {ratio} (ref 0.0–4.4)
Cholesterol, Total: 145 mg/dL (ref 100–199)
HDL: 51 mg/dL (ref >39)
LDL Chol Calc (NIH): 83 mg/dL (ref 0–99)
Triglycerides: 49 mg/dL (ref 0–149)
VLDL Cholesterol Cal: 11 mg/dL (ref 5–40)

## 2023-05-12 LAB — SPECIMEN STATUS REPORT

## 2023-05-12 LAB — TSH: TSH: 1.29 u[IU]/mL (ref 0.450–4.500)

## 2023-05-12 LAB — LIPOPROTEIN A (LPA): Lipoprotein (a): 65.5 nmol/L (ref <75.0)

## 2023-05-16 NOTE — Progress Notes (Signed)
No critical labs need to be addressed urgently. We will discuss labs in detail at upcoming office visit.   

## 2023-05-19 ENCOUNTER — Encounter: Payer: No Typology Code available for payment source | Admitting: Family Medicine

## 2023-06-22 ENCOUNTER — Other Ambulatory Visit (HOSPITAL_COMMUNITY): Payer: Self-pay

## 2023-06-22 ENCOUNTER — Encounter: Payer: Self-pay | Admitting: Obstetrics

## 2023-06-22 ENCOUNTER — Encounter: Payer: Self-pay | Admitting: Family Medicine

## 2023-06-22 ENCOUNTER — Ambulatory Visit (INDEPENDENT_AMBULATORY_CARE_PROVIDER_SITE_OTHER): Payer: 59 | Admitting: Family Medicine

## 2023-06-22 VITALS — BP 118/78 | HR 73 | Temp 98.2°F | Ht 65.5 in | Wt 185.0 lb

## 2023-06-22 DIAGNOSIS — Z0000 Encounter for general adult medical examination without abnormal findings: Secondary | ICD-10-CM

## 2023-06-22 DIAGNOSIS — G43009 Migraine without aura, not intractable, without status migrainosus: Secondary | ICD-10-CM

## 2023-06-22 MED ORDER — SUMATRIPTAN SUCCINATE 100 MG PO TABS
100.0000 mg | ORAL_TABLET | ORAL | 0 refills | Status: AC | PRN
  Filled 2023-06-22: qty 9, 30d supply, fill #0
  Filled 2023-09-05: qty 9, 15d supply, fill #0

## 2023-06-22 NOTE — Assessment & Plan Note (Signed)
Chronic, with recent worsening Provided prescription for Imitrex to take for severe headache.  Reviewed healthy lifestyles and trigger avoidance for migraine prevention. Encouraged hydration, not skipping meals and adequate sleep.  If frequency of headache continues to be more than 2/month she will consider follow-up for migraine prophylaxis.

## 2023-06-22 NOTE — Progress Notes (Signed)
Patient ID: [name], female    DOB: [birth year], 42 y.o.   MRN: [id number]  This visit was conducted in person.  BP 118/78 (BP Location: Left Arm, Patient Position: Sitting, Cuff Size: Normal)   Pulse 73   Temp 98.2 F (36.8 C) (Oral)   Ht 5' 5.5" (1.664 m)   Wt 185 lb (83.9 kg)   SpO2 98%   BMI 30.32 kg/m    CC:  Chief Complaint  Patient presents with   Annual Exam    Subjective:   HPI: [name] is a 42 y.o. female presenting on 06/22/2023 for Annual Exam   Doing well overall. She has noted new headaches occurring 2 times a week mild to moderate ongoing for several years but more frequent. She had always felt they were tension headaches but she had significant improvement with a severe headache when taking her husband's Relpax. Pain is in the posterior head and neck radiating up to the top of her head, throbbing, minimal light and sound sensitivity She does note it trigger including MSG, inadequate sleep and caffeine amount alteration. She denies numbness weakness or specific neurologic symptoms.  No preceding aura.  Reviewed labs in detail.  Diet: good, heart healthy Exercise: Has puppy daily walking Body mass index is 30.32 kg/m.  Lab Results  Component Value Date   CHOL 145 05/11/2023   HDL 51 05/11/2023   LDLCALC 83 05/11/2023   TRIG 49 05/11/2023   CHOLHDL 2.8 05/11/2023   The 10-year ASCVD risk score (Arnett DK, et al., 2019) is: 0.4%   Values used to calculate the score:     Age: 61 years     Sex: Female     Is Non-Hispanic African American: No     Diabetic: No     Tobacco smoker: No     Systolic Blood Pressure: 118 mmHg     Is BP treated: No     HDL Cholesterol: 51 mg/dL     Total Cholesterol: 145 mg/dL  Normal lipoprotein a   Wt Readings from Last 3 Encounters:  06/22/23 185 lb (83.9 kg)  05/17/22 174 lb 8 oz (79.2 kg)  01/20/22 172 lb 12.8 oz (78.4 kg)        Flowsheet Row Office Visit from 06/22/2023 in [facility]  [facility] [place]  PHQ-2 Total Score 0      Chronic diarrhea: pending appt with new GI MD.  Relevant past medical, surgical, family and social history reviewed and updated as indicated. Interim medical history since our last visit reviewed. Allergies and medications reviewed and updated. Outpatient Medications Prior to Visit  Medication Sig Dispense Refill   clobetasol ointment (TEMOVATE) 0.05 % Apply a thin layer to affected area(s) topically 2 (two) times daily. 30 g 0   brompheniramine-pseudoephedrine-DM 30-2-10 MG/5ML syrup Take 5 mLs by mouth 4 (four) times daily as needed. 120 mL 0   fluticasone (FLONASE) 50 MCG/ACT nasal spray SPRAY 2 SPRAYS INTO EACH NOSTRIL EVERY DAY 48 mL 1   levonorgestrel (LILETTA, 52 MG,) 20.1 MCG/DAY IUD IUD Take 1 device by intrauterine route.     oseltamivir (TAMIFLU) 75 MG capsule Take 1 capsule (75 mg total) by mouth 2 (two) times daily. 10 capsule 0   No facility-administered medications prior to visit.     Per HPI unless specifically indicated in ROS section below Review of Systems  Constitutional:  Negative for fatigue and fever.  HENT:  Negative for congestion.  Eyes:  Negative for pain.  Respiratory:  Negative for cough and shortness of breath.   Cardiovascular:  Negative for chest pain, palpitations and leg swelling.  Gastrointestinal:  Negative for abdominal pain.  Genitourinary:  Negative for dysuria and vaginal bleeding.  Musculoskeletal:  Negative for back pain.  Neurological:  Negative for syncope, light-headedness and headaches.  Psychiatric/Behavioral:  Negative for dysphoric mood.    Objective:  BP 118/78 (BP Location: Left Arm, Patient Position: Sitting, Cuff Size: Normal)   Pulse 73   Temp 98.2 F (36.8 C) (Oral)   Ht 5' 5.5" (1.664 m)   Wt 185 lb (83.9 kg)   SpO2 98%   BMI 30.32 kg/m   Wt Readings from Last 3 Encounters:  06/22/23 185 lb (83.9 kg)  05/17/22 174 lb 8 oz (79.2 kg)  01/20/22 172 lb 12.8  oz (78.4 kg)      Physical Exam Vitals and nursing note reviewed.  Constitutional:      General: She is not in acute distress.    Appearance: Normal appearance. She is well-developed. She is not ill-appearing or toxic-appearing.  HENT:     Head: Normocephalic.     Right Ear: Hearing, tympanic membrane, ear canal and external ear normal.     Left Ear: Hearing, tympanic membrane, ear canal and external ear normal.     Nose: Nose normal.  Eyes:     General: Lids are normal. Lids are everted, no foreign bodies appreciated.     Conjunctiva/sclera: Conjunctivae normal.     Pupils: Pupils are equal, round, and reactive to light.  Neck:     Thyroid: No thyroid mass or thyromegaly.     Vascular: No carotid bruit.     Trachea: Trachea normal.  Cardiovascular:     Rate and Rhythm: Normal rate and regular rhythm.     Heart sounds: Normal heart sounds, S1 normal and S2 normal. No murmur heard.    No gallop.  Pulmonary:     Effort: Pulmonary effort is normal. No respiratory distress.     Breath sounds: Normal breath sounds. No wheezing, rhonchi or rales.  Abdominal:     General: Bowel sounds are normal. There is no distension or abdominal bruit.     Palpations: Abdomen is soft. There is no fluid wave or mass.     Tenderness: There is no abdominal tenderness. There is no guarding or rebound.     Hernia: No hernia is present.  Musculoskeletal:     Cervical back: Normal range of motion and neck supple.  Lymphadenopathy:     Cervical: No cervical adenopathy.  Skin:    General: Skin is warm and dry.     Findings: No rash.  Neurological:     Mental Status: She is alert.     Cranial Nerves: No cranial nerve deficit.     Sensory: No sensory deficit.  Psychiatric:        Mood and Affect: Mood is not anxious or depressed.        Speech: Speech normal.        Behavior: Behavior normal. Behavior is cooperative.        Judgment: Judgment normal.       Results for orders placed or performed  in visit on 05/11/23  Lipoprotein A (LPA)  Result Value Ref Range   Lipoprotein (a) 65.5 <75.0 nmol/L  TSH  Result Value Ref Range   TSH 1.290 0.450 - 4.500 uIU/mL  Comprehensive metabolic panel  Result Value Ref  Range   Glucose 82 70 - 99 mg/dL   BUN 11 6 - 24 mg/dL   Creatinine, Ser 7.82 0.57 - 1.00 mg/dL   eGFR 956 >21 HY/QMV/7.84   BUN/Creatinine Ratio 15 9 - 23   Sodium 137 134 - 144 mmol/L   Potassium 4.2 3.5 - 5.2 mmol/L   Chloride 101 96 - 106 mmol/L   CO2 25 20 - 29 mmol/L   Calcium 9.2 8.7 - 10.2 mg/dL   Total Protein 7.2 6.0 - 8.5 g/dL   Albumin 4.3 3.9 - 4.9 g/dL   Globulin, Total 2.9 1.5 - 4.5 g/dL   Bilirubin Total 0.6 0.0 - 1.2 mg/dL   Alkaline Phosphatase 38 (L) 44 - 121 IU/L   AST 13 0 - 40 IU/L   ALT 12 0 - 32 IU/L  Lipid panel  Result Value Ref Range   Cholesterol, Total 145 100 - 199 mg/dL   Triglycerides 49 0 - 149 mg/dL   HDL 51 >69 mg/dL   VLDL Cholesterol Cal 11 5 - 40 mg/dL   LDL Chol Calc (NIH) 83 0 - 99 mg/dL   Chol/HDL Ratio 2.8 0.0 - 4.4 ratio  Specimen status report  Result Value Ref Range   specimen status report Comment      COVID 19 screen:  No recent travel or known exposure to COVID19 The patient denies respiratory symptoms of COVID 19 at this time. The importance of social distancing was discussed today.   Assessment and Plan The patient's preventative maintenance and recommended screening tests for an annual wellness exam were reviewed in full today. Brought up to date unless services declined.  Counselled on the importance of diet, exercise, and its role in overall health and mortality. The patient's FH and SH was reviewed, including their home life, tobacco status, and drug and alcohol status.    Vaccines: Uptodate tdap. COVID x 4 , flu  in 05/2021 Pap/DVE:  nml pap 2022,  per Dr. Ernestina Penna  Mammo:   2022, done at [facility] per pt ,  no early family history of breast cancer except Maternal aunt age 7. Colon:  No early family  history of colon cancer Smoking Status: none ETOH/ drug GEX:BMWU/XLKG  HIV screen:   done in past.  Hep C done  Problem List Items Addressed This Visit     Migraine without aura    Chronic, with recent worsening Provided prescription for Imitrex to take for severe headache.  Reviewed healthy lifestyles and trigger avoidance for migraine prevention. Encouraged hydration, not skipping meals and adequate sleep.  If frequency of headache continues to be more than 2/month she will consider follow-up for migraine prophylaxis.      Relevant Medications   SUMAtriptan (IMITREX) 100 MG tablet   Other Visit Diagnoses     Routine general medical examination at a health care facility    -  Primary      Kerby Nora, MD

## 2023-07-04 ENCOUNTER — Other Ambulatory Visit (HOSPITAL_COMMUNITY): Payer: Self-pay

## 2023-09-01 ENCOUNTER — Other Ambulatory Visit (HOSPITAL_COMMUNITY): Payer: Self-pay

## 2023-09-01 ENCOUNTER — Other Ambulatory Visit: Payer: Self-pay | Admitting: Family Medicine

## 2023-09-01 ENCOUNTER — Encounter: Payer: Self-pay | Admitting: Family Medicine

## 2023-09-01 MED ORDER — OSELTAMIVIR PHOSPHATE 75 MG PO CAPS
75.0000 mg | ORAL_CAPSULE | Freq: Every day | ORAL | 0 refills | Status: DC
  Filled 2023-09-01: qty 10, 10d supply, fill #0

## 2023-09-01 MED ORDER — OSELTAMIVIR PHOSPHATE 75 MG PO CAPS: 75.0000 mg | ORAL_CAPSULE | Freq: Every day | ORAL | 0 refills | Status: DC

## 2023-09-04 DIAGNOSIS — Z1231 Encounter for screening mammogram for malignant neoplasm of breast: Secondary | ICD-10-CM | POA: Diagnosis not present

## 2023-09-04 LAB — HM MAMMOGRAPHY

## 2023-09-05 ENCOUNTER — Other Ambulatory Visit (HOSPITAL_COMMUNITY): Payer: Self-pay

## 2023-09-05 ENCOUNTER — Encounter: Payer: Self-pay | Admitting: Family Medicine

## 2024-04-17 ENCOUNTER — Ambulatory Visit (INDEPENDENT_AMBULATORY_CARE_PROVIDER_SITE_OTHER): Payer: Self-pay | Admitting: Podiatry

## 2024-04-17 ENCOUNTER — Other Ambulatory Visit (HOSPITAL_COMMUNITY): Payer: Self-pay

## 2024-04-17 ENCOUNTER — Ambulatory Visit (INDEPENDENT_AMBULATORY_CARE_PROVIDER_SITE_OTHER)

## 2024-04-17 ENCOUNTER — Encounter: Payer: Self-pay | Admitting: Podiatry

## 2024-04-17 DIAGNOSIS — M76821 Posterior tibial tendinitis, right leg: Secondary | ICD-10-CM | POA: Diagnosis not present

## 2024-04-17 DIAGNOSIS — M722 Plantar fascial fibromatosis: Secondary | ICD-10-CM

## 2024-04-17 MED ORDER — MELOXICAM 15 MG PO TABS
15.0000 mg | ORAL_TABLET | Freq: Every day | ORAL | 0 refills | Status: AC
  Filled 2024-04-17: qty 30, 30d supply, fill #0

## 2024-04-17 NOTE — Progress Notes (Signed)
  Subjective:  Patient ID: [name], female    DOB: Mar 28, 1981,   MRN: [id number]  Chief Complaint  Patient presents with   Foot Pain    My right foot is very stiff when I get up in the morning on the bottom of my heel.  It's a constant dull ache throughout the day.  It's a sharp pain when I get up in the morning.  I feel it a little bit in my left foot too but it goes away quickly.    43 y.o. female presents for concern as above. She has tried some stretching and ibuprofen  and not made much of a difference. Started after a trip to the beach and walking in flip flops. She is a Teacher, early years/pre. She is not diabetic.   [name] Denies any other pedal complaints. Denies n/v/f/c.   Past Medical History:  Diagnosis Date   Carpal tunnel syndrome on left 08/2016    Objective:  Physical Exam: Vascular: DP/PT pulses 2/4 bilateral. CFT <3 seconds. Normal hair growth on digits. No edema.  Skin. No lacerations or abrasions bilateral feet.  Musculoskeletal: MMT 5/5 bilateral lower extremities in DF, PF, Inversion and Eversion. Deceased ROM in DF of ankle joint. Tender to the medial calcaneal tubercle right . No pain with achillesor arch. No pain with calcaneal squeeze. Some pain proximally around PT tendon. Pain with DF PF and inversion. .  Neurological: Sensation intact to light touch.   Assessment:   1. Plantar fasciitis, right   2. Posterior tibial tendon dysfunction (PTTD) of right lower extremity      Plan:  Patient was evaluated and treated and all questions answered. Discussed plantar fasciitis and PTTD with patient.  X-rays reviewed and discussed with patient. No acute fractures or dislocations noted. Mild spurring noted at inferior calcaneus.  Discussed treatment options including, ice, NSAIDS, supportive shoes, bracing, and stretching. Stretching exercises provided to be done on a daily basis.   Prescription for meloxicam  provided and sent to pharmacy. Cmp reviewed and kideny function wnl.   PF brace dispensed.  Follow-up 6 weeks or sooner if any problems arise. In the meantime, encouraged to call the office with any questions, concerns, change in symptoms.      Asberry Failing, DPM

## 2024-04-17 NOTE — Patient Instructions (Addendum)
 Plantar Fasciitis (Heel Spur Syndrome) with Rehab The plantar fascia is a fibrous, ligament-like, soft-tissue structure that spans the bottom of the foot. Plantar fasciitis is a condition that causes pain in the foot due to inflammation of the tissue. SYMPTOMS  Pain and tenderness on the underneath side of the foot. Pain that worsens with standing or walking. CAUSES  Plantar fasciitis is caused by irritation and injury to the plantar fascia on the underneath side of the foot. Common mechanisms of injury include: Direct trauma to bottom of the foot. Damage to a small nerve that runs under the foot where the main fascia attaches to the heel bone. Stress placed on the plantar fascia due to bone spurs. RISK INCREASES WITH:  Activities that place stress on the plantar fascia (running, jumping, pivoting, or cutting). Poor strength and flexibility. Improperly fitted shoes. Tight calf muscles. Flat feet. Failure to warm-up properly before activity. Obesity. PREVENTION Warm up and stretch properly before activity. Allow for adequate recovery between workouts. Maintain physical fitness: Strength, flexibility, and endurance. Cardiovascular fitness. Maintain a health body weight. Avoid stress on the plantar fascia. Wear properly fitted shoes, including arch supports for individuals who have flat feet.  PROGNOSIS  If treated properly, then the symptoms of plantar fasciitis usually resolve without surgery. However, occasionally surgery is necessary.  RELATED COMPLICATIONS  Recurrent symptoms that may result in a chronic condition. Problems of the lower back that are caused by compensating for the injury, such as limping. Pain or weakness of the foot during push-off following surgery. Chronic inflammation, scarring, and partial or complete fascia tear, occurring more often from repeated injections.  TREATMENT  Treatment initially involves the use of ice and medication to help reduce pain and  inflammation. The use of strengthening and stretching exercises may help reduce pain with activity, especially stretches of the Achilles tendon. These exercises may be performed at home or with a therapist. Your caregiver may recommend that you use heel cups of arch supports to help reduce stress on the plantar fascia. Occasionally, corticosteroid injections are given to reduce inflammation. If symptoms persist for greater than 6 months despite non-surgical (conservative), then surgery may be recommended.   MEDICATION  If pain medication is necessary, then nonsteroidal anti-inflammatory medications, such as aspirin and ibuprofen , or other minor pain relievers, such as acetaminophen , are often recommended. Do not take pain medication within 7 days before surgery. Prescription pain relievers may be given if deemed necessary by your caregiver. Use only as directed and only as much as you need. Corticosteroid injections may be given by your caregiver. These injections should be reserved for the most serious cases, because they may only be given a certain number of times.  HEAT AND COLD Cold treatment (icing) relieves pain and reduces inflammation. Cold treatment should be applied for 10 to 15 minutes every 2 to 3 hours for inflammation and pain and immediately after any activity that aggravates your symptoms. Use ice packs or massage the area with a piece of ice (ice massage). Heat treatment may be used prior to performing the stretching and strengthening activities prescribed by your caregiver, physical therapist, or athletic trainer. Use a heat pack or soak the injury in warm water.  SEEK IMMEDIATE MEDICAL CARE IF: Treatment seems to offer no benefit, or the condition worsens. Any medications produce adverse side effects.  EXERCISES- RANGE OF MOTION (ROM) AND STRETCHING EXERCISES - Plantar Fasciitis (Heel Spur Syndrome) These exercises may help you when beginning to rehabilitate your injury. Your  symptoms may resolve with or without further involvement from your physician, physical therapist or athletic trainer. While completing these exercises, remember:  Restoring tissue flexibility helps normal motion to return to the joints. This allows healthier, less painful movement and activity. An effective stretch should be held for at least 30 seconds. A stretch should never be painful. You should only feel a gentle lengthening or release in the stretched tissue.  RANGE OF MOTION - Toe Extension, Flexion Sit with your right / left leg crossed over your opposite knee. Grasp your toes and gently pull them back toward the top of your foot. You should feel a stretch on the bottom of your toes and/or foot. Hold this stretch for 10 seconds. Now, gently pull your toes toward the bottom of your foot. You should feel a stretch on the top of your toes and or foot. Hold this stretch for 10 seconds. Repeat  times. Complete this stretch 3 times per day.   RANGE OF MOTION - Ankle Dorsiflexion, Active Assisted Remove shoes and sit on a chair that is preferably not on a carpeted surface. Place right / left foot under knee. Extend your opposite leg for support. Keeping your heel down, slide your right / left foot back toward the chair until you feel a stretch at your ankle or calf. If you do not feel a stretch, slide your bottom forward to the edge of the chair, while still keeping your heel down. Hold this stretch for 10 seconds. Repeat 3 times. Complete this stretch 2 times per day.   STRETCH  Gastroc, Standing Place hands on wall. Extend right / left leg, keeping the front knee somewhat bent. Slightly point your toes inward on your back foot. Keeping your right / left heel on the floor and your knee straight, shift your weight toward the wall, not allowing your back to arch. You should feel a gentle stretch in the right / left calf. Hold this position for 10 seconds. Repeat 3 times. Complete this  stretch 2 times per day.  STRETCH  Soleus, Standing Place hands on wall. Extend right / left leg, keeping the other knee somewhat bent. Slightly point your toes inward on your back foot. Keep your right / left heel on the floor, bend your back knee, and slightly shift your weight over the back leg so that you feel a gentle stretch deep in your back calf. Hold this position for 10 seconds. Repeat 3 times. Complete this stretch 2 times per day.  STRETCH  Gastrocsoleus, Standing  Note: This exercise can place a lot of stress on your foot and ankle. Please complete this exercise only if specifically instructed by your caregiver.  Place the ball of your right / left foot on a step, keeping your other foot firmly on the same step. Hold on to the wall or a rail for balance. Slowly lift your other foot, allowing your body weight to press your heel down over the edge of the step. You should feel a stretch in your right / left calf. Hold this position for 10 seconds. Repeat this exercise with a slight bend in your right / left knee. Repeat 3 times. Complete this stretch 2 times per day.   STRENGTHENING EXERCISES - Plantar Fasciitis (Heel Spur Syndrome)  These exercises may help you when beginning to rehabilitate your injury. They may resolve your symptoms with or without further involvement from your physician, physical therapist or athletic trainer. While completing these exercises, remember:  Muscles can  gain both the endurance and the strength needed for everyday activities through controlled exercises. Complete these exercises as instructed by your physician, physical therapist or athletic trainer. Progress the resistance and repetitions only as guided.  STRENGTH - Towel Curls Sit in a chair positioned on a non-carpeted surface. Place your foot on a towel, keeping your heel on the floor. Pull the towel toward your heel by only curling your toes. Keep your heel on the floor. Repeat 3 times.  Complete this exercise 2 times per day.  STRENGTH - Ankle Inversion Secure one end of a rubber exercise band/tubing to a fixed object (table, pole). Loop the other end around your foot just before your toes. Place your fists between your knees. This will focus your strengthening at your ankle. Slowly, pull your big toe up and in, making sure the band/tubing is positioned to resist the entire motion. Hold this position for 10 seconds. Have your muscles resist the band/tubing as it slowly pulls your foot back to the starting position. Repeat 3 times. Complete this exercises 2 times per day.  Document Released: 07/25/2005 Document Revised: 10/17/2011 Document Reviewed: 11/06/2008 [facility] Patient Information 2014 [place], [place]. Posterior Tibial Tendinitis Rehab Ask your health care provider which exercises are safe for you. Do exercises exactly as told by your provider and adjust them as directed. It's normal to feel mild stretching, pulling, tightness, or discomfort as you do these exercises. Stop right away if you feel sudden pain or your pain gets worse. Do not begin these exercises until told by your provider. Stretching and range-of-motion exercises These exercises warm up your muscles and joints and improve the movement and flexibility in your ankle and foot. These exercises may also help to relieve pain. Standing wall calf stretch, knee straight  Stand with your hands against a wall. Extend your left / right leg behind you, and bend your front knee slightly. If told, place a folded washcloth under the arch of your foot for support. Point the toes of your back foot slightly inward. Keeping your heels on the floor and your back knee straight, shift your weight toward the wall. Do not allow your back to arch. You should feel a gentle stretch in your upper left / right calf. Hold this position for __________ seconds. Repeat __________ times. Complete this exercise __________ times a  day. Standing wall calf stretch, knee bent  Stand with your hands against a wall. Extend your left / right leg behind you, and bend your front knee slightly. If told, place a folded washcloth under the arch of your foot for support. Point the toes of your back foot slightly inward. Unlock your back knee so it's bent. Keep your heels on the floor. You should feel a gentle stretch deep in your lower left / right calf. Hold this position for __________ seconds. Repeat __________ times. Complete this exercise __________ times a day. Strengthening exercises These exercises build strength and endurance in your ankle and foot. Endurance is the ability to use your muscles for a long time, even after they get tired. Ankle inversion with band  Secure one end of a rubber exercise band or tubing to a fixed object, such as a table leg or a pole, that will stay still when the band is pulled. Loop the other end of the band around the middle of your left / right foot. Sit on the floor facing the object with your left / right leg extended. The band or tube should be slightly  tense when your foot is relaxed. Leading with your big toe, slowly bring your left / right foot and ankle inward, toward your other foot (inversion). Hold this position for __________ seconds. Slowly return your foot to the starting position. Repeat __________ times. Complete this exercise __________ times a day. Towel curls  Sit in a chair on a non-carpeted surface, and put your feet on the floor. Place a towel in front of your feet. If told by your provider, add a __________ weight to the end of the towel. Keeping your heel on the floor, put your left / right foot on the towel. Pull the towel toward you by grabbing the towel with your toes and curling them under. Keep your heel on the floor while you do this. Let your toes relax. Grab the towel with your toes again. Keep going until the towel is completely underneath your  foot. Repeat __________ times. Complete this exercise __________ times a day. Balance exercise This exercise improves or maintains your balance. Balance is important in preventing falls. Single leg stand  Without wearing shoes, stand near a railing or in a doorway. You may hold on to the railing or doorframe as needed for balance. Stand on your left / right foot. Keep your big toe down on the floor and try to keep your arch lifted. If balancing in this position is too easy, try the exercise with your eyes closed or while standing on a pillow. Hold this position for __________ seconds. Repeat __________ times. Complete this exercise __________ times a day. This information is not intended to replace advice given to you by your health care provider. Make sure you discuss any questions you have with your health care provider. Document Revised: 07/27/2022 Document Reviewed: 07/27/2022 [employer]vier Patient Education  2024 [employer].

## 2024-05-07 DIAGNOSIS — H5213 Myopia, bilateral: Secondary | ICD-10-CM | POA: Diagnosis not present

## 2024-05-07 DIAGNOSIS — H40013 Open angle with borderline findings, low risk, bilateral: Secondary | ICD-10-CM | POA: Diagnosis not present

## 2024-05-29 ENCOUNTER — Ambulatory Visit: Admitting: Podiatry

## 2024-05-29 ENCOUNTER — Other Ambulatory Visit (HOSPITAL_COMMUNITY): Payer: Self-pay

## 2024-05-29 ENCOUNTER — Encounter: Payer: Self-pay | Admitting: Podiatry

## 2024-05-29 DIAGNOSIS — M722 Plantar fascial fibromatosis: Secondary | ICD-10-CM

## 2024-05-29 MED ORDER — DEXAMETHASONE SODIUM PHOSPHATE 120 MG/30ML IJ SOLN
4.0000 mg | Freq: Once | INTRAMUSCULAR | Status: AC
  Administered 2024-05-29: 4 mg via INTRA_ARTICULAR

## 2024-05-29 MED ORDER — DICLOFENAC SODIUM 75 MG PO TBEC
75.0000 mg | DELAYED_RELEASE_TABLET | Freq: Two times a day (BID) | ORAL | 0 refills | Status: AC
  Filled 2024-05-29: qty 60, 30d supply, fill #0

## 2024-05-29 MED ORDER — TRIAMCINOLONE ACETONIDE 10 MG/ML IJ SUSP
2.5000 mg | Freq: Once | INTRAMUSCULAR | Status: AC
  Administered 2024-05-29: 2.5 mg via INTRA_ARTICULAR

## 2024-05-29 NOTE — Progress Notes (Signed)
  Subjective:  Patient ID: [name], female    DOB: 1981/05/01,   MRN: [id number]  Chief Complaint  Patient presents with   Plantar Fasciitis    It's not any better.  I feel like the left one is getting worse as well.  I am a little disappointed.   43 y.o. female presents for follow-up of right plantar fasciitis.  Relates it is not any better.  Also relates the left side is also starting to be a problem.  States she has been stretching.  She has not been wearing the brace.  Relates meloxicam  does not help.. Denies any other pedal complaints. Denies n/v/f/c.   Past Medical History:  Diagnosis Date   Carpal tunnel syndrome on left 08/2016    Objective:  Physical Exam: Vascular: DP/PT pulses 2/4 bilateral. CFT <3 seconds. Normal hair growth on digits. No edema.  Skin. No lacerations or abrasions bilateral feet.  Musculoskeletal: MMT 5/5 bilateral lower extremities in DF, PF, Inversion and Eversion. Deceased ROM in DF of ankle joint. Tender to the medial calcaneal tubercle right . No pain with achillesor arch. No pain with calcaneal squeeze. Some pain proximally around PT tendon. Pain with DF PF and inversion. .  Tender now on the left medial medial tubercle.  Pain at Achilles or arch Neurological: Sensation intact to light touch.   Assessment:   1. Plantar fasciitis, right   2. Plantar fasciitis, left       Plan:  Patient was evaluated and treated and all questions answered. Discussed plantar fasciitis and PTTD with patient.  X-rays reviewed and discussed with patient. No acute fractures or dislocations noted. Mild spurring noted at inferior calcaneus.  Discussed treatment options including, ice, NSAIDS, supportive shoes, bracing, and stretching.  Continue stretching.  Ambulatory referral to PT. Diclofenac sent to pharmacy. Injection provided.  Procedure below. Follow-up 6 weeks or sooner if any problems arise. In the meantime, encouraged to call the office with any  questions, concerns, change in symptoms.   Procedure: Injection Tendon/Ligament Discussed alternatives, risks, complications and verbal consent was obtained.  Location: Right plantar fascia. Skin Prep: Alcohol. Injectate: 1cc 0.5% marcaine  plain, 1 cc dexamethasone 0.5 cc kenalog  Disposition: Patient tolerated procedure well. Injection site dressed with a band-aid.  Post-injection care was discussed and return precautions discussed.       Asberry Failing, DPM

## 2024-06-10 ENCOUNTER — Encounter: Payer: Self-pay | Admitting: Family Medicine

## 2024-06-10 DIAGNOSIS — Z01419 Encounter for gynecological examination (general) (routine) without abnormal findings: Secondary | ICD-10-CM | POA: Diagnosis not present

## 2024-06-10 DIAGNOSIS — Z124 Encounter for screening for malignant neoplasm of cervix: Secondary | ICD-10-CM | POA: Diagnosis not present

## 2024-06-10 DIAGNOSIS — Z0142 Encounter for cervical smear to confirm findings of recent normal smear following initial abnormal smear: Secondary | ICD-10-CM | POA: Diagnosis not present

## 2024-06-10 DIAGNOSIS — Z131 Encounter for screening for diabetes mellitus: Secondary | ICD-10-CM

## 2024-06-10 DIAGNOSIS — Z13220 Encounter for screening for lipoid disorders: Secondary | ICD-10-CM

## 2024-06-10 DIAGNOSIS — Z01411 Encounter for gynecological examination (general) (routine) with abnormal findings: Secondary | ICD-10-CM | POA: Diagnosis not present

## 2024-06-10 DIAGNOSIS — Z1331 Encounter for screening for depression: Secondary | ICD-10-CM | POA: Diagnosis not present

## 2024-06-10 DIAGNOSIS — Z113 Encounter for screening for infections with a predominantly sexual mode of transmission: Secondary | ICD-10-CM | POA: Diagnosis not present

## 2024-06-18 ENCOUNTER — Other Ambulatory Visit: Payer: 59

## 2024-06-18 ENCOUNTER — Telehealth: Payer: Self-pay | Admitting: *Deleted

## 2024-06-18 NOTE — Telephone Encounter (Signed)
-----   Message from Veva JINNY Ferrari sent at 06/11/2024 10:41 AM EST ----- Regarding: Lab orders for Wed, 11.12.25 Patient is scheduled for CPX labs, please order future labs, Thanks , Veva

## 2024-06-19 ENCOUNTER — Ambulatory Visit: Payer: Self-pay | Admitting: Family Medicine

## 2024-06-19 ENCOUNTER — Other Ambulatory Visit

## 2024-06-19 ENCOUNTER — Other Ambulatory Visit (INDEPENDENT_AMBULATORY_CARE_PROVIDER_SITE_OTHER)

## 2024-06-19 DIAGNOSIS — Z13220 Encounter for screening for lipoid disorders: Secondary | ICD-10-CM

## 2024-06-19 DIAGNOSIS — Z131 Encounter for screening for diabetes mellitus: Secondary | ICD-10-CM

## 2024-06-19 LAB — LIPID PANEL
Cholesterol: 137 mg/dL (ref 0–200)
HDL: 46.3 mg/dL (ref >39.00)
LDL Cholesterol: 80 mg/dL (ref 0–99)
NonHDL: 91.09
Total CHOL/HDL Ratio: 3
Triglycerides: 53 mg/dL (ref 0.0–149.0)
VLDL: 10.6 mg/dL (ref 0.0–40.0)

## 2024-06-19 LAB — COMPREHENSIVE METABOLIC PANEL WITH GFR
ALT: 12 U/L (ref 0–35)
AST: 13 U/L (ref 0–37)
Albumin: 4.5 g/dL (ref 3.5–5.2)
Alkaline Phosphatase: 32 U/L — ABNORMAL LOW (ref 39–117)
BUN: 9 mg/dL (ref 6–23)
CO2: 28 meq/L (ref 19–32)
Calcium: 8.9 mg/dL (ref 8.4–10.5)
Chloride: 103 meq/L (ref 96–112)
Creatinine, Ser: 0.69 mg/dL (ref 0.40–1.20)
GFR: 105.41 mL/min (ref >60.00)
Glucose, Bld: 78 mg/dL (ref 70–99)
Potassium: 3.8 meq/L (ref 3.5–5.1)
Sodium: 137 meq/L (ref 135–145)
Total Bilirubin: 0.8 mg/dL (ref 0.2–1.2)
Total Protein: 7.3 g/dL (ref 6.0–8.3)

## 2024-06-19 LAB — HEMOGLOBIN A1C: Hgb A1c MFr Bld: 5.4 % (ref 4.6–6.5)

## 2024-06-19 NOTE — Progress Notes (Signed)
 No critical labs need to be addressed urgently. We will discuss labs in detail at upcoming office visit.

## 2024-06-24 ENCOUNTER — Other Ambulatory Visit: Payer: Self-pay

## 2024-06-24 ENCOUNTER — Ambulatory Visit: Attending: Podiatry

## 2024-06-24 DIAGNOSIS — M79672 Pain in left foot: Secondary | ICD-10-CM | POA: Insufficient documentation

## 2024-06-24 DIAGNOSIS — M722 Plantar fascial fibromatosis: Secondary | ICD-10-CM | POA: Insufficient documentation

## 2024-06-24 DIAGNOSIS — M79671 Pain in right foot: Secondary | ICD-10-CM | POA: Diagnosis not present

## 2024-06-24 DIAGNOSIS — M6281 Muscle weakness (generalized): Secondary | ICD-10-CM | POA: Diagnosis not present

## 2024-06-24 NOTE — Therapy (Signed)
 OUTPATIENT PHYSICAL THERAPY LOWER EXTREMITY EVALUATION   Patient Name: [name] MRN: [id number] DOB:Mar 09, 1981, 43 y.o., female Today's Date: 06/24/2024  END OF SESSION:  PT End of Session - 06/24/24 1435     Visit Number 1    Number of Visits 9    Date for Recertification  08/19/24    Authorization Type [employer]    PT Start Time 1317    PT Stop Time 1358    PT Time Calculation (min) 41 min          Past Medical History:  Diagnosis Date   Carpal tunnel syndrome on left 08/2016   Past Surgical History:  Procedure Laterality Date   CARPAL TUNNEL RELEASE Right 04/05/2016   Procedure: RIGHT LIMITED OPEN CARPAL TUNNEL RELEASE;  Surgeon: Elsie Mussel, MD;  Location: [facility];  Service: Orthopedics;  Laterality: Right;   CARPAL TUNNEL RELEASE Left 08/19/2016   Procedure: LEFT LIMITED OPEN CARPAL TUNNEL RELEASE;  Surgeon: Elsie Mussel, MD;  Location: [facility];  Service: Orthopedics;  Laterality: Left;   DILATION AND EVACUATION  03/19/2012   Procedure: DILATATION AND EVACUATION;  Surgeon: Burnard LABOR. Kandyce, MD;  Location: [facility];  Service: Gynecology;  Laterality: N/A;  With Ultrasound   EXCISIONAL BIOSPY LEFT DEEP NECK MASS  10/15/2007   Benign salivary gland tissue w/ chronic inflammation   WISDOM TOOTH EXTRACTION     Patient Active Problem List   Diagnosis Date Noted   Chronic diarrhea 06/14/2021   Stone of salivary gland or duct 04/13/2019   Pigmented skin lesion of uncertain nature 09/07/2018   Incomplete right bundle branch block (RBBB) determined by electrocardiography 06/26/2018   Low back pain 12/01/2015   Mild scoliosis 12/01/2015   Lipoma of back 12/01/2015   Postpartum care following vaginal delivery (8/10) 03/19/2015   Gestational hypertension 03/18/2015   Postpartum state 03/18/2015   Spotting affecting pregnancy in first trimester 08/31/2014   Migraine without aura 12/18/2007   SYSTOLIC MURMUR 12/18/2007     PCP: Avelina Greig BRAVO, MD   REFERRING PROVIDER: Joya Stabs, DPM   REFERRING DIAG:  M72.2 (ICD-10-CM) - Plantar fasciitis, right M72.2 (ICD-10-CM) - Plantar fasciitis, left  THERAPY DIAG:  Pain in right foot  Pain in left foot  Muscle weakness (generalized)  Rationale for Evaluation and Treatment: Rehabilitation  ONSET DATE: July 2025  SUBJECTIVE:   SUBJECTIVE STATEMENT: Pt presents to PT with roughly 4 month hx of increasing bilateral foot/ankle pain R>L. Pain started after walking on beach for a long time while on vacation. It is worst with first step in morning and slowly alleviates, but it is very painful at night as she is on her feet throughout the day. Hasn't been able to walk or exercise due to foot pain.   PERTINENT HISTORY: See PMH  PAIN:  Are you having pain?  Yes: NPRS scale: 4/10 Worst: 9/10 Pain location: bilateral heels, medial heel Pain description: sharp, sore Aggravating factors: first step in morning, walking Relieving factors: rest  PRECAUTIONS: None  RED FLAGS: None   WEIGHT BEARING RESTRICTIONS: No  FALLS:  Has patient fallen in last 6 months? No  LIVING ENVIRONMENT: Lives with: lives with their family Lives in: House/apartment  OCCUPATION: Pharmacist at [employer]  PLOF: Independent  PATIENT GOALS: decrease foot pain, be able to walk recreationally with decreased pain  NEXT MD VISIT: 07/29/2024  OBJECTIVE:  Note: Objective measures were completed at Evaluation unless otherwise noted.  DIAGNOSTIC FINDINGS: See imaging  PATIENT SURVEYS:  LEFS  Extreme difficulty/unable (0), Quite a bit of difficulty (1), Moderate difficulty (2), Little difficulty (3), No difficulty (4) Survey date:  06/24/2024  Any of your usual work, housework or school activities 3  2. Usual hobbies, recreational or sporting activities 3  3. Getting into/out of the bath 4  4. Walking between rooms 3  5. Putting on socks/shoes 4  6. Squatting  4  7.  Lifting an object, like a bag of groceries from the floor 4  8. Performing light activities around your home 4  9. Performing heavy activities around your home 4  10. Getting into/out of a car 4  11. Walking 2 blocks 2  12. Walking 1 mile 2  13. Going up/down 10 stairs (1 flight) 4  14. Standing for 1 hour 3  15.  sitting for 1 hour 4  16. Running on even ground 2  17. Running on uneven ground 2  18. Making sharp turns while running fast 3  19. Hopping  3  20. Rolling over in bed 4  Score total:  57/80     COGNITION: Overall cognitive status: Within functional limits for tasks assessed     SENSATION: WFL  POSTURE: No Significant postural limitations  PALPATION: TTP to bilateral distal heel R>L  LOWER EXTREMITY ROM:  Active ROM Right eval Left eval  Ankle dorsiflexion 10 12  Ankle plantarflexion WNL WNL  Ankle inversion WNL WNL  Ankle eversion WNL WNL   (Blank rows = not tested)  LOWER EXTREMITY MMT:  MMT Right eval Left eval  Hip flexion    Hip extension    Hip abduction    Hip adduction    Hip internal rotation    Hip external rotation    Knee flexion    Knee extension    Ankle dorsiflexion 5 5  Ankle plantarflexion 5 5  Ankle inversion 4 5  Ankle eversion 5 5   (Blank rows = not tested)  LOWER EXTREMITY SPECIAL TESTS:  Ankle special tests: Great toe extension test: positive   FUNCTIONAL TESTS:  SLS: assess next session  GAIT: Distance walked: 32ft Assistive device utilized: None Level of assistance: Complete Independence Comments: no overt deviations                                                                                                                          TREATMENT: [facility] Adult PT Treatment:                                                DATE: 06/24/2024 Therapeutic Exercise: Standing gastroc stretch x 30 ea Standing soleus stretch x 30 ea Seated calf raise with ball x 10 Ankle inv/ev x 5 RTB  PATIENT EDUCATION:   Education details: eval findings, LEFS, HEP, POC Person educated: Patient Education method: Explanation,  Demonstration, and Handouts Education comprehension: verbalized understanding and returned demonstration  HOME EXERCISE PROGRAM: Access Code: [email or web address] URL: [email or web address] Date: 06/24/2024 Prepared by: Alm Kingdom  Exercises - Standing Gastroc Stretch  - 2 x daily - 7 x weekly - 2-3 reps - 30 sec hold - Soleus Stretch on Wall  - 2 x daily - 7 x weekly - 2-3 reps - 30 sec hold - Seated Calf Raise With Small Ball at Heels  - 1 x daily - 7 x weekly - 3 sets - 10 reps - Ankle Inversion with Resistance  - 1 x daily - 7 x weekly - 3 sets - 10 reps - red band hold - Ankle Eversion with Resistance  - 1 x daily - 7 x weekly - 3 sets - 10 reps - red band hold - Sidelying Hip Abduction  - 1 x daily - 7 x weekly - 3 sets - 10 reps  ASSESSMENT:  CLINICAL IMPRESSION: Patient is a 43 y.o. F who was seen today for physical therapy evaluation and treatment for bilateral foot/ankle pain R>L. Physical findings are consistent with DPM impression as pt demonstrates decrease in bilateral DF ROM, foot intrinsic strength, and gait/mobility deficits. LEFS score shows moderate disability in performance of home ADLs and community activities. Pt would benefit from skilled PT services working on improving calf length and foot intrinsic strength in order to decrease pain and improve comfort.    OBJECTIVE IMPAIRMENTS: Abnormal gait, decreased activity tolerance, decreased mobility, decreased ROM, decreased strength, impaired flexibility, and pain  ACTIVITY LIMITATIONS: sitting, standing, squatting, stairs, transfers, and locomotion level  PARTICIPATION LIMITATIONS: meal prep, cleaning, driving, shopping, community activity, occupation, and yard work  PERSONAL FACTORS: Time since onset of injury/illness/exacerbation are also affecting patient's functional outcome.   REHAB POTENTIAL:  Excellent  CLINICAL DECISION MAKING: Stable/uncomplicated  EVALUATION COMPLEXITY: Low   GOALS: Goals reviewed with patient? No  SHORT TERM GOALS: Target date: 07/15/2024   Pt will be compliant and knowledgeable with initial HEP for improved comfort and carryover Baseline: initial HEP given  Goal status: INITIAL  2.  Pt will self report bilateral foot/ankle pain no greater than 7/10 for improved comfort and functional ability Baseline: 9/10 at worst Goal status: INITIAL   LONG TERM GOALS: Target date: 08/19/2024   Pt will improve LEFS to no less than 68/80 as proxy for functional improvement with home ADLs and higher level community activity Baseline: 57/80 Goal status: INITIAL   2.  Pt will self report bilateral foot/ankle pain no greater than 3/10 for improved comfort and functional ability Baseline: 9/10 at worst Goal status: INITIAL   3.  Pt will improve bilateral ankle DF to no less than 14 degrees for improved comfort and functional mobility Baseline: see chart Goal status: INITIAL  4.  Pt will improve R ankle inv MMT to at least 5/5 for improved comfort and functional ability while decreasing pain Baseline:  Goal status: INITIAL   PLAN:  PT FREQUENCY: 1-2x/week  PT DURATION: 8 weeks  PLANNED INTERVENTIONS: 97164- PT Re-evaluation, 97110-Therapeutic exercises, 97530- Therapeutic activity, [id number]- Neuromuscular re-education, 97535- Self Care, 02859- Manual therapy, [id number]- Gait training, [phone]- Electrical stimulation (unattended), [id number]- Electrical stimulation (manual), 97016- Vasopneumatic device, 20560 (1-2 muscles), 20561 (3+ muscles)- Dry Needling, Patient/Family education, Cryotherapy, and Moist heat  PLAN FOR NEXT SESSION: assess HEP response, foot intrinsic strengthening, calf stretching    Alm JAYSON Kingdom, PT 06/24/2024, 2:36 PM

## 2024-06-25 ENCOUNTER — Ambulatory Visit: Payer: 59 | Admitting: Family Medicine

## 2024-06-25 ENCOUNTER — Encounter: Payer: Self-pay | Admitting: Family Medicine

## 2024-06-25 VITALS — BP 120/78 | HR 61 | Temp 97.3°F | Ht 65.5 in | Wt 195.4 lb

## 2024-06-25 DIAGNOSIS — G43009 Migraine without aura, not intractable, without status migrainosus: Secondary | ICD-10-CM

## 2024-06-25 DIAGNOSIS — Z0000 Encounter for general adult medical examination without abnormal findings: Secondary | ICD-10-CM

## 2024-06-25 DIAGNOSIS — K529 Noninfective gastroenteritis and colitis, unspecified: Secondary | ICD-10-CM | POA: Diagnosis not present

## 2024-06-25 DIAGNOSIS — M7711 Lateral epicondylitis, right elbow: Secondary | ICD-10-CM | POA: Insufficient documentation

## 2024-06-25 DIAGNOSIS — Z1211 Encounter for screening for malignant neoplasm of colon: Secondary | ICD-10-CM | POA: Diagnosis not present

## 2024-06-25 NOTE — Assessment & Plan Note (Signed)
 Chronic, no aura.[name] occurring 1-2  times a month.  Improve with ibuprofen .

## 2024-06-25 NOTE — Progress Notes (Signed)
 Patient ID: [name], female    DOB: 1981-06-05, 43 y.o.   MRN: [id number]  This visit was conducted in person.  BP 120/78   Pulse 61   Temp (!) 97.3 F (36.3 C) (Temporal)   Ht 5' 5.5 (1.664 m)   Wt 195 lb 6 oz (88.6 kg)   SpO2 97%   BMI 32.02 kg/m    CC:  Chief Complaint  Patient presents with   Annual Exam    Subjective:   HPI: [name] is a 43 y.o. female presenting on 06/25/2024 for Annual Exam Reviewed labs in detail.  Diet: good, heart healthy Exercise:  limited now with plantar fasciitis. Body mass index is 32.02 kg/m.  Lab Results  Component Value Date   CHOL 137 06/19/2024   HDL 46.30 06/19/2024   LDLCALC 80 06/19/2024   TRIG 53.0 06/19/2024   CHOLHDL 3 06/19/2024   The 10-year ASCVD risk score (Arnett DK, et al., 2019) is: 0.5%   Values used to calculate the score:     Age: 30 years     Clincally relevant sex: Female     Is Non-Hispanic African American: No     Diabetic: No     Tobacco smoker: No     Systolic Blood Pressure: 120 mmHg     Is BP treated: No     HDL Cholesterol: 46.3 mg/dL     Total Cholesterol: 137 mg/dL  Normal lipoprotein a   Wt Readings from Last 3 Encounters:  06/25/24 195 lb 6 oz (88.6 kg)  06/22/23 185 lb (83.9 kg)  05/17/22 174 lb 8 oz (79.2 kg)      Flowsheet Row Office Visit from 06/25/2024 in [facility]  PHQ-2 Total Score 0     Relevant past medical, surgical, family and social history reviewed and updated as indicated. Interim medical history since our last visit reviewed. Allergies and medications reviewed and updated. Outpatient Medications Prior to Visit  Medication Sig Dispense Refill   clobetasol  ointment (TEMOVATE ) 0.05 % Apply a thin layer to affected area(s) topically 2 (two) times daily. 30 g 0   diclofenac (VOLTAREN) 75 MG EC tablet Take 1 tablet (75 mg total) by mouth 2 (two) times daily. 60 tablet 0   levonorgestrel (LILETTA, 52 MG,) 20.1  MCG/DAY IUD IUD 1 each by Intrauterine route once.     meloxicam  (MOBIC ) 15 MG tablet Take 1 tablet (15 mg total) by mouth daily. 30 tablet 0   SUMAtriptan  (IMITREX ) 100 MG tablet Take 1 tablet (100 mg total) by mouth every 2 (two) hours as needed for migraine. May repeat in 2 hours if headache persists or recurs. 10 tablet 0   oseltamivir  (TAMIFLU ) 75 MG capsule Take 1 capsule (75 mg total) by mouth daily. (Patient not taking: Reported on 05/29/2024) 10 capsule 0   No facility-administered medications prior to visit.     Per HPI unless specifically indicated in ROS section below Review of Systems  Constitutional:  Negative for fatigue and fever.  HENT:  Negative for congestion.   Eyes:  Negative for pain.  Respiratory:  Negative for cough and shortness of breath.   Cardiovascular:  Negative for chest pain, palpitations and leg swelling.  Gastrointestinal:  Negative for abdominal pain.  Genitourinary:  Negative for dysuria and vaginal bleeding.  Musculoskeletal:  Negative for back pain.  Neurological:  Negative for syncope, light-headedness and headaches.  Psychiatric/Behavioral:  Negative for dysphoric mood.  Objective:  BP 120/78   Pulse 61   Temp (!) 97.3 F (36.3 C) (Temporal)   Ht 5' 5.5 (1.664 m)   Wt 195 lb 6 oz (88.6 kg)   SpO2 97%   BMI 32.02 kg/m   Wt Readings from Last 3 Encounters:  06/25/24 195 lb 6 oz (88.6 kg)  06/22/23 185 lb (83.9 kg)  05/17/22 174 lb 8 oz (79.2 kg)      Physical Exam Vitals and nursing note reviewed.  Constitutional:      General: She is not in acute distress.    Appearance: Normal appearance. She is well-developed. She is not ill-appearing or toxic-appearing.  HENT:     Head: Normocephalic.     Right Ear: Hearing, tympanic membrane, ear canal and external ear normal.     Left Ear: Hearing, tympanic membrane, ear canal and external ear normal.     Nose: Nose normal.  Eyes:     General: Lids are normal. Lids are everted, no foreign  bodies appreciated.     Conjunctiva/sclera: Conjunctivae normal.     Pupils: Pupils are equal, round, and reactive to light.  Neck:     Thyroid : No thyroid  mass or thyromegaly.     Vascular: No carotid bruit.     Trachea: Trachea normal.  Cardiovascular:     Rate and Rhythm: Normal rate and regular rhythm.     Heart sounds: Normal heart sounds, S1 normal and S2 normal. No murmur heard.    No gallop.  Pulmonary:     Effort: Pulmonary effort is normal. No respiratory distress.     Breath sounds: Normal breath sounds. No wheezing, rhonchi or rales.  Abdominal:     General: Bowel sounds are normal. There is no distension or abdominal bruit.     Palpations: Abdomen is soft. There is no fluid wave or mass.     Tenderness: There is no abdominal tenderness. There is no guarding or rebound.     Hernia: No hernia is present.  Musculoskeletal:     Right elbow: Decreased range of motion. Tenderness present.     Cervical back: Normal range of motion and neck supple.     Comments: Ttp over lateral epicondyle, insertion  Lymphadenopathy:     Cervical: No cervical adenopathy.  Skin:    General: Skin is warm and dry.     Findings: No rash.  Neurological:     Mental Status: She is alert.     Cranial Nerves: No cranial nerve deficit.     Sensory: No sensory deficit.  Psychiatric:        Mood and Affect: Mood is not anxious or depressed.        Speech: Speech normal.        Behavior: Behavior normal. Behavior is cooperative.        Judgment: Judgment normal.       Results for orders placed or performed in visit on 06/19/24  Comprehensive metabolic panel with GFR   Collection Time: 06/19/24 10:30 AM  Result Value Ref Range   Sodium 137 135 - 145 mEq/L   Potassium 3.8 3.5 - 5.1 mEq/L   Chloride 103 96 - 112 mEq/L   CO2 28 19 - 32 mEq/L   Glucose, Bld 78 70 - 99 mg/dL   BUN 9 6 - 23 mg/dL   Creatinine, Ser 9.30 0.40 - 1.20 mg/dL   Total Bilirubin 0.8 0.2 - 1.2 mg/dL   Alkaline  Phosphatase 32 (L) 39 -  117 U/L   AST 13 0 - 37 U/L   ALT 12 0 - 35 U/L   Total Protein 7.3 6.0 - 8.3 g/dL   Albumin 4.5 3.5 - 5.2 g/dL   GFR 894.58 >39.99 mL/min   Calcium 8.9 8.4 - 10.5 mg/dL  Lipid panel   Collection Time: 06/19/24 10:30 AM  Result Value Ref Range   Cholesterol 137 0 - 200 mg/dL   Triglycerides 46.9 0.0 - 149.0 mg/dL   HDL 53.69 >60.99 mg/dL   VLDL 89.3 0.0 - 59.9 mg/dL   LDL Cholesterol 80 0 - 99 mg/dL   Total CHOL/HDL Ratio 3    NonHDL 91.09   Hemoglobin A1c   Collection Time: 06/19/24 10:30 AM  Result Value Ref Range   Hgb A1c MFr Bld 5.4 4.6 - 6.5 %     COVID 19 screen:  No recent travel or known exposure to COVID19 The patient denies respiratory symptoms of COVID 19 at this time. The importance of social distancing was discussed today.   Assessment and Plan The patient's preventative maintenance and recommended screening tests for an annual wellness exam were reviewed in full today. Brought up to date unless services declined.  Counselled on the importance of diet, exercise, and its role in overall health and mortality. The patient's FH and SH was reviewed, including their home life, tobacco status, and drug and alcohol status.    Vaccines: Uptodate tdap. COVID x 4 , flu  in 05/2021 Pap/DVE:  nml pap 2022,  per Dr. Kandyce... did pap [place] [id number].. will request results.  Mammo:   08/2023, done at [facility]YN per pt ,  no early family history of breast cancer except Maternal aunt age 11. Colon:  No early family history of colon cancer Smoking Status: none ETOH/ drug ldz:mjmz/wnwz  HIV screen:   done in past.  Hep C done  Problem List Items Addressed This Visit     Chronic diarrhea    Chronic,  stable      Migraine without aura    Chronic, no aura.[name] occurring 1-2  times a month.  Improve with ibuprofen .      Other Visit Diagnoses       Routine general medical examination at a health care facility    -  Primary       Greig Ring, MD

## 2024-06-25 NOTE — Assessment & Plan Note (Signed)
 Chronic, stable

## 2024-06-25 NOTE — Assessment & Plan Note (Signed)
 Acute, limit repetitive motion. Start home PT. Can try topical or oral NSAID.

## 2024-07-06 ENCOUNTER — Telehealth: Admitting: Nurse Practitioner

## 2024-07-06 DIAGNOSIS — J029 Acute pharyngitis, unspecified: Secondary | ICD-10-CM | POA: Diagnosis not present

## 2024-07-06 MED ORDER — LIDOCAINE VISCOUS HCL 2 % MT SOLN: 15.0000 mL | OROMUCOSAL | 0 refills | Status: AC | PRN

## 2024-07-06 MED ORDER — IBUPROFEN 600 MG PO TABS: 600.0000 mg | ORAL_TABLET | Freq: Three times a day (TID) | ORAL | 0 refills | Status: AC | PRN

## 2024-07-06 NOTE — Progress Notes (Signed)
 We are sorry that you are not feeling well.  Here is how we plan to help!  Your symptoms indicate a likely viral infection (Pharyngitis).   Pharyngitis is inflammation in the back of the throat which can cause a sore throat, scratchiness and sometimes difficulty swallowing.   Pharyngitis is typically caused by a respiratory virus and will just run its course.  Please keep in mind that your symptoms could last up to 10 days.    For throat pain, we recommend over the counter oral pain relief medications such as acetaminophen  or aspirin, or anti-inflammatory medications such as ibuprofen  or naproxen  sodium.  Topical treatments such as oral throat lozenges or sprays may be used as needed. For throat pain, I have prescribed a Viscous Lidocaine  2% solution. Swallow 5-10 mL every 4-6 hours as needed for sore throat. DO NOT eat or drink anything for 15-20 minutes after swallowing to allow the medication to coat the throat. I have also sent prescription strength motrin .  Avoid close contact with loved ones, especially the very young and elderly.  Remember to wash your hands thoroughly throughout the day as this is the number one way to prevent the spread of infection! We also recommend that you periodically wipe down door knobs and counters with disinfectant.  After careful review of your answers, I would not recommend and antibiotic for your condition.  Antibiotics should not be used to treat conditions that we suspect are caused by viruses like the virus that causes the common cold or flu.  Providers prescribe antibiotics to treat infections caused by bacteria. Antibiotics are very powerful in treating bacterial infections when they are used properly.  To maintain their effectiveness, they should be used only when necessary.  Overuse of antibiotics has resulted in the development of super bugs that are resistant to treatment!    Some people with strep throat, however, can have atypical symptoms. As such, if  anything continued to progress despite treatment recommendations, you may need formal testing in clinic or office.  Home Care: Only take medications as instructed by your medical team. Do not drink alcohol while taking these medications. A steam or ultrasonic humidifier can help congestion.  You can place a towel over your head and breathe in the steam from hot water coming from a faucet. Avoid close contacts especially the very young and the elderly. Cover your mouth when you cough or sneeze. Always remember to wash your hands.  Get Help Right Away If: You develop worsening fever or throat pain. You develop a severe head ache or visual changes. Your symptoms persist after you have completed your treatment plan.  Make sure you Understand these instructions. Will watch your condition. Will get help right away if you are not doing well or get worse.  Your e-visit answers were reviewed by a board certified advanced clinical practitioner to complete your personal care plan.  Depending on the condition, your plan could have included both over the counter or prescription medications.  If there is a problem please reply once you have received a response from your provider.  Your safety is important to us .  If you have drug allergies check your prescription carefully.    You can use MyChart to ask questions about todays visit, request a non-urgent call back, or ask for a work or school excuse for 24 hours related to this e-Visit. If it has been greater than 24 hours you will need to follow up with your provider, or enter a  new e-Visit to address those concerns.  You will get an e-mail in the next two days asking about your experience.  I hope that your e-visit has been valuable and will speed your recovery. Thank you for using e-visits.   I have spent 5 minutes in review of e-visit questionnaire, review and updating patient chart, medical decision making and response to patient.   Adaleena Mooers W  Matilde Pottenger, NP

## 2024-07-09 ENCOUNTER — Ambulatory Visit

## 2024-07-10 ENCOUNTER — Ambulatory Visit: Attending: Podiatry

## 2024-07-10 DIAGNOSIS — M79671 Pain in right foot: Secondary | ICD-10-CM | POA: Diagnosis present

## 2024-07-10 DIAGNOSIS — M6281 Muscle weakness (generalized): Secondary | ICD-10-CM | POA: Diagnosis present

## 2024-07-10 DIAGNOSIS — M79672 Pain in left foot: Secondary | ICD-10-CM | POA: Diagnosis present

## 2024-07-10 NOTE — Therapy (Signed)
 OUTPATIENT PHYSICAL THERAPY TREATMENT   Patient Name: [name] MRN: [id number] DOB:May 04, 1981, 43 y.o., female Today's Date: 07/10/2024  END OF SESSION:  PT End of Session - 07/10/24 1448     Visit Number 2    Number of Visits 9    Date for Recertification  08/19/24    Authorization Type [employer] [place]    PT Start Time 1448    PT Stop Time 1528    PT Time Calculation (min) 40 min           Past Medical History:  Diagnosis Date   Carpal tunnel syndrome on left 08/2016   Past Surgical History:  Procedure Laterality Date   CARPAL TUNNEL RELEASE Right 04/05/2016   Procedure: RIGHT LIMITED OPEN CARPAL TUNNEL RELEASE;  Surgeon: Elsie Mussel, MD;  Location: [facility];  Service: Orthopedics;  Laterality: Right;   CARPAL TUNNEL RELEASE Left 08/19/2016   Procedure: LEFT LIMITED OPEN CARPAL TUNNEL RELEASE;  Surgeon: Elsie Mussel, MD;  Location: [facility];  Service: Orthopedics;  Laterality: Left;   DILATION AND EVACUATION  03/19/2012   Procedure: DILATATION AND EVACUATION;  Surgeon: Burnard LABOR. Kandyce, MD;  Location: [facility];  Service: Gynecology;  Laterality: N/A;  With Ultrasound   EXCISIONAL BIOSPY LEFT DEEP NECK MASS  10/15/2007   Benign salivary gland tissue w/ chronic inflammation   WISDOM TOOTH EXTRACTION     Patient Active Problem List   Diagnosis Date Noted   Right lateral epicondylitis 06/25/2024   Chronic diarrhea 06/14/2021   Incomplete right bundle branch block (RBBB) determined by electrocardiography 06/26/2018   Low back pain 12/01/2015   Mild scoliosis 12/01/2015   Lipoma of back 12/01/2015   Gestational hypertension 03/18/2015   Migraine without aura 12/18/2007   SYSTOLIC MURMUR 12/18/2007    PCP: Avelina Greig BRAVO, MD   REFERRING PROVIDER: Joya Stabs, DPM   REFERRING DIAG:  M72.2 (ICD-10-CM) - Plantar fasciitis, right M72.2 (ICD-10-CM) - Plantar fasciitis, left  THERAPY DIAG:  Pain in right foot  Pain in  left foot  Muscle weakness (generalized)  Rationale for Evaluation and Treatment: Rehabilitation  ONSET DATE: July 2025  SUBJECTIVE:   SUBJECTIVE STATEMENT: Pt presents to PT with reports of 2/10 R foot pain, no current L foot pain. Has been compliant with HEP with no adverse effect.  EVAL: Pt presents to PT with roughly 4 month hx of increasing bilateral foot/ankle pain R>L. Pain started after walking on beach for a long time while on vacation. It is worst with first step in morning and slowly alleviates, but it is very painful at night as she is on her feet throughout the day. Hasn't been able to walk or exercise due to foot pain.   PERTINENT HISTORY: See PMH  PAIN:  Are you having pain?  Yes: NPRS scale: 4/10 Worst: 9/10 Pain location: bilateral heels, medial heel Pain description: sharp, sore Aggravating factors: first step in morning, walking Relieving factors: rest  PRECAUTIONS: None  RED FLAGS: None   WEIGHT BEARING RESTRICTIONS: No  FALLS:  Has patient fallen in last 6 months? No  LIVING ENVIRONMENT: Lives with: lives with their family Lives in: House/apartment  OCCUPATION: Pharmacist at [employer]  PLOF: Independent  PATIENT GOALS: decrease foot pain, be able to walk recreationally with decreased pain  NEXT MD VISIT: 07/29/2024  OBJECTIVE:  Note: Objective measures were completed at Evaluation unless otherwise noted.  DIAGNOSTIC FINDINGS: See imaging  PATIENT SURVEYS:  LEFS  Extreme difficulty/unable (0),  Quite a bit of difficulty (1), Moderate difficulty (2), Little difficulty (3), No difficulty (4) Survey date:  06/24/2024  Any of your usual work, housework or school activities 3  2. Usual hobbies, recreational or sporting activities 3  3. Getting into/out of the bath 4  4. Walking between rooms 3  5. Putting on socks/shoes 4  6. Squatting  4  7. Lifting an object, like a bag of groceries from the floor 4  8. Performing light activities around your  home 4  9. Performing heavy activities around your home 4  10. Getting into/out of a car 4  11. Walking 2 blocks 2  12. Walking 1 mile 2  13. Going up/down 10 stairs (1 flight) 4  14. Standing for 1 hour 3  15.  sitting for 1 hour 4  16. Running on even ground 2  17. Running on uneven ground 2  18. Making sharp turns while running fast 3  19. Hopping  3  20. Rolling over in bed 4  Score total:  57/80     COGNITION: Overall cognitive status: Within functional limits for tasks assessed     SENSATION: WFL  POSTURE: No Significant postural limitations  PALPATION: TTP to bilateral distal heel R>L  LOWER EXTREMITY ROM:  Active ROM Right eval Left eval  Ankle dorsiflexion 10 12  Ankle plantarflexion WNL WNL  Ankle inversion WNL WNL  Ankle eversion WNL WNL   (Blank rows = not tested)  LOWER EXTREMITY MMT:  MMT Right eval Left eval  Hip flexion    Hip extension    Hip abduction    Hip adduction    Hip internal rotation    Hip external rotation    Knee flexion    Knee extension    Ankle dorsiflexion 5 5  Ankle plantarflexion 5 5  Ankle inversion 4 5  Ankle eversion 5 5   (Blank rows = not tested)  LOWER EXTREMITY SPECIAL TESTS:  Ankle special tests: Great toe extension test: positive   FUNCTIONAL TESTS:  SLS: assess next session  GAIT: Distance walked: 37ft Assistive device utilized: None Level of assistance: Complete Independence Comments: no overt deviations                                                                                                                          TREATMENT: [facility] Adult PT Treatment:                                                DATE: 07/10/2024 Therapeutic Exercise: Rec bike lvl 3 x 3 min for functional  Slant board gastroc/soleus stretch 2x30 ea Eccentric heel lowering 2in step 2x10 Tandem stance on foam 2x30 each Standing heel raise with ball 2x15 Wobble board 2x30 fwd/bwd Ankle DF/inv/ev 2x10 RTB Manual  Therapy: STM and TPR to R  calf  [facility] Adult PT Treatment:                                                DATE: 06/24/2024 Therapeutic Exercise: Standing gastroc stretch x 30 ea Standing soleus stretch x 30 ea Seated calf raise with ball x 10 Ankle inv/ev x 5 RTB  PATIENT EDUCATION:  Education details: eval findings, LEFS, HEP, POC Person educated: Patient Education method: Explanation, Demonstration, and Handouts Education comprehension: verbalized understanding and returned demonstration  HOME EXERCISE PROGRAM: Access Code: [email or web address] URL: [email or web address] Date: 06/24/2024 Prepared by: Alm Kingdom  Exercises - Standing Gastroc Stretch  - 2 x daily - 7 x weekly - 2-3 reps - 30 sec hold - Soleus Stretch on Wall  - 2 x daily - 7 x weekly - 2-3 reps - 30 sec hold - Seated Calf Raise With Small Ball at Heels  - 1 x daily - 7 x weekly - 3 sets - 10 reps - Ankle Inversion with Resistance  - 1 x daily - 7 x weekly - 3 sets - 10 reps - red band hold - Ankle Eversion with Resistance  - 1 x daily - 7 x weekly - 3 sets - 10 reps - red band hold - Sidelying Hip Abduction  - 1 x daily - 7 x weekly - 3 sets - 10 reps  ASSESSMENT:  CLINICAL IMPRESSION: Pt was able to complete all prescribed exercises with no adverse effect. Today we progressed ankle stability and calf length. Pt responded well to manual therapy interventions noting decrease in tension.    EVAL: Patient is a 43 y.o. F who was seen today for physical therapy evaluation and treatment for bilateral foot/ankle pain R>L. Physical findings are consistent with DPM impression as pt demonstrates decrease in bilateral DF ROM, foot intrinsic strength, and gait/mobility deficits. LEFS score shows moderate disability in performance of home ADLs and community activities. Pt would benefit from skilled PT services working on improving calf length and foot intrinsic strength in order to decrease pain and improve comfort.     OBJECTIVE IMPAIRMENTS: Abnormal gait, decreased activity tolerance, decreased mobility, decreased ROM, decreased strength, impaired flexibility, and pain  ACTIVITY LIMITATIONS: sitting, standing, squatting, stairs, transfers, and locomotion level  PARTICIPATION LIMITATIONS: meal prep, cleaning, driving, shopping, community activity, occupation, and yard work  PERSONAL FACTORS: Time since onset of injury/illness/exacerbation are also affecting patient's functional outcome.   REHAB POTENTIAL: Excellent  CLINICAL DECISION MAKING: Stable/uncomplicated  EVALUATION COMPLEXITY: Low   GOALS: Goals reviewed with patient? No  SHORT TERM GOALS: Target date: 07/15/2024   Pt will be compliant and knowledgeable with initial HEP for improved comfort and carryover Baseline: initial HEP given  Goal status: INITIAL  2.  Pt will self report bilateral foot/ankle pain no greater than 7/10 for improved comfort and functional ability Baseline: 9/10 at worst Goal status: INITIAL   LONG TERM GOALS: Target date: 08/19/2024   Pt will improve LEFS to no less than 68/80 as proxy for functional improvement with home ADLs and higher level community activity Baseline: 57/80 Goal status: INITIAL   2.  Pt will self report bilateral foot/ankle pain no greater than 3/10 for improved comfort and functional ability Baseline: 9/10 at worst Goal status: INITIAL   3.  Pt will improve bilateral ankle DF to no less than 14 degrees for improved comfort  and functional mobility Baseline: see chart Goal status: INITIAL  4.  Pt will improve R ankle inv MMT to at least 5/5 for improved comfort and functional ability while decreasing pain Baseline:  Goal status: INITIAL   PLAN:  PT FREQUENCY: 1-2x/week  PT DURATION: 8 weeks  PLANNED INTERVENTIONS: 97164- PT Re-evaluation, 97110-Therapeutic exercises, 97530- Therapeutic activity, [id number]- Neuromuscular re-education, 97535- Self Care, [id number]- Manual therapy, [id number]-  Gait training, [phone]- Electrical stimulation (unattended), [id number]- Electrical stimulation (manual), 97016- Vasopneumatic device, 20560 (1-2 muscles), 20561 (3+ muscles)- Dry Needling, Patient/Family education, Cryotherapy, and Moist heat  PLAN FOR NEXT SESSION: assess HEP response, foot intrinsic strengthening, calf stretching    Alm JAYSON Kingdom, PT 07/10/2024, 3:43 PM

## 2024-07-11 ENCOUNTER — Telehealth: Admitting: Physician Assistant

## 2024-07-11 DIAGNOSIS — J069 Acute upper respiratory infection, unspecified: Secondary | ICD-10-CM

## 2024-07-11 MED ORDER — PROMETHAZINE-DM 6.25-15 MG/5ML PO SYRP: 5.0000 mL | ORAL_SOLUTION | Freq: Four times a day (QID) | ORAL | 0 refills | Status: AC | PRN

## 2024-07-11 NOTE — Progress Notes (Signed)

## 2024-07-18 ENCOUNTER — Ambulatory Visit

## 2024-07-24 ENCOUNTER — Ambulatory Visit: Admitting: Physical Therapy

## 2024-07-24 ENCOUNTER — Ambulatory Visit

## 2024-07-24 ENCOUNTER — Encounter: Payer: Self-pay | Admitting: Physical Therapy

## 2024-07-24 DIAGNOSIS — M79671 Pain in right foot: Secondary | ICD-10-CM | POA: Diagnosis not present

## 2024-07-24 DIAGNOSIS — M79672 Pain in left foot: Secondary | ICD-10-CM

## 2024-07-24 DIAGNOSIS — M6281 Muscle weakness (generalized): Secondary | ICD-10-CM

## 2024-07-24 NOTE — Therapy (Signed)
 OUTPATIENT PHYSICAL THERAPY TREATMENT   Patient Name: [name] MRN: [id number] DOB:10-03-1980, 43 y.o., female Today's Date: 07/24/2024  END OF SESSION:  PT End of Session - 07/24/24 1137     Visit Number 3    Number of Visits 9    Date for Recertification  08/19/24    Authorization Type [employer]    PT Start Time 1135    PT Stop Time 1220    PT Time Calculation (min) 45 min    Activity Tolerance Patient tolerated treatment well    Behavior During Therapy [facility] for tasks assessed/performed           Past Medical History:  Diagnosis Date   Carpal tunnel syndrome on left 08/2016   Past Surgical History:  Procedure Laterality Date   CARPAL TUNNEL RELEASE Right 04/05/2016   Procedure: RIGHT LIMITED OPEN CARPAL TUNNEL RELEASE;  Surgeon: Elsie Mussel, MD;  Location: [facility] [place];  Service: Orthopedics;  Laterality: Right;   CARPAL TUNNEL RELEASE Left 08/19/2016   Procedure: LEFT LIMITED OPEN CARPAL TUNNEL RELEASE;  Surgeon: Elsie Mussel, MD;  Location: [facility];  Service: Orthopedics;  Laterality: Left;   DILATION AND EVACUATION  03/19/2012   Procedure: DILATATION AND EVACUATION;  Surgeon: Burnard LABOR. Kandyce, MD;  Location: [facility];  Service: Gynecology;  Laterality: N/A;  With Ultrasound   EXCISIONAL BIOSPY LEFT DEEP NECK MASS  10/15/2007   Benign salivary gland tissue w/ chronic inflammation   WISDOM TOOTH EXTRACTION     Patient Active Problem List   Diagnosis Date Noted   Right lateral epicondylitis 06/25/2024   Chronic diarrhea 06/14/2021   Incomplete right bundle branch block (RBBB) determined by electrocardiography 06/26/2018   Low back pain 12/01/2015   Mild scoliosis 12/01/2015   Lipoma of back 12/01/2015   Gestational hypertension 03/18/2015   Migraine without aura 12/18/2007   SYSTOLIC MURMUR 12/18/2007    PCP: Avelina Greig BRAVO, MD   REFERRING PROVIDER: Joya Stabs, DPM   REFERRING DIAG:  M72.2 (ICD-10-CM) -  Plantar fasciitis, right M72.2 (ICD-10-CM) - Plantar fasciitis, left  THERAPY DIAG:  Pain in right foot  Pain in left foot  Muscle weakness (generalized)  Rationale for Evaluation and Treatment: Rehabilitation  ONSET DATE: July 2025  SUBJECTIVE:   SUBJECTIVE STATEMENT: Pt presents to PT with reports of 1/10 R foot pain, 1/10 L foot pain. Has been compliant with HEP with no adverse effect.  EVAL: Pt presents to PT with roughly 4 month hx of increasing bilateral foot/ankle pain R>L. Pain started after walking on beach for a long time while on vacation. It is worst with first step in morning and slowly alleviates, but it is very painful at night as she is on her feet throughout the day. Hasn't been able to walk or exercise due to foot pain.   PERTINENT HISTORY: See PMH  PAIN:  Are you having pain?  Yes: NPRS scale: 4/10 Worst: 9/10 Pain location: bilateral heels, medial heel Pain description: sharp, sore Aggravating factors: first step in morning, walking Relieving factors: rest  PRECAUTIONS: None  RED FLAGS: None   WEIGHT BEARING RESTRICTIONS: No  FALLS:  Has patient fallen in last 6 months? No  LIVING ENVIRONMENT: Lives with: lives with their family Lives in: House/apartment  OCCUPATION: Pharmacist at [employer]  PLOF: Independent  PATIENT GOALS: decrease foot pain, be able to walk recreationally with decreased pain  NEXT MD VISIT: 07/29/2024  OBJECTIVE:  Note: Objective measures were completed at  Evaluation unless otherwise noted.  DIAGNOSTIC FINDINGS: See imaging  PATIENT SURVEYS:  LEFS  Extreme difficulty/unable (0), Quite a bit of difficulty (1), Moderate difficulty (2), Little difficulty (3), No difficulty (4) Survey date:  06/24/2024  Any of your usual work, housework or school activities 3  2. Usual hobbies, recreational or sporting activities 3  3. Getting into/out of the bath 4  4. Walking between rooms 3  5. Putting on socks/shoes 4  6. Squatting   4  7. Lifting an object, like a bag of groceries from the floor 4  8. Performing light activities around your home 4  9. Performing heavy activities around your home 4  10. Getting into/out of a car 4  11. Walking 2 blocks 2  12. Walking 1 mile 2  13. Going up/down 10 stairs (1 flight) 4  14. Standing for 1 hour 3  15.  sitting for 1 hour 4  16. Running on even ground 2  17. Running on uneven ground 2  18. Making sharp turns while running fast 3  19. Hopping  3  20. Rolling over in bed 4  Score total:  57/80     COGNITION: Overall cognitive status: Within functional limits for tasks assessed     SENSATION: WFL  POSTURE: No Significant postural limitations  PALPATION: TTP to bilateral distal heel R>L  LOWER EXTREMITY ROM:  Active ROM Right eval Left eval  Ankle dorsiflexion 10 12  Ankle plantarflexion WNL WNL  Ankle inversion WNL WNL  Ankle eversion WNL WNL   (Blank rows = not tested)  LOWER EXTREMITY MMT:  MMT Right eval Left eval  Hip flexion    Hip extension    Hip abduction    Hip adduction    Hip internal rotation    Hip external rotation    Knee flexion    Knee extension    Ankle dorsiflexion 5 5  Ankle plantarflexion 5 5  Ankle inversion 4 5  Ankle eversion 5 5   (Blank rows = not tested)  LOWER EXTREMITY SPECIAL TESTS:  Ankle special tests: Great toe extension test: positive   FUNCTIONAL TESTS:  SLS: assess next session  GAIT: Distance walked: 55ft Assistive device utilized: None Level of assistance: Complete Independence Comments: no overt deviations                                                                                                                          TREATMENT:  [facility] Adult PT Treatment:                                                DATE: 07-24-24 Therapeutic Exercise: Rec bike lvl 3 x 6 min for functional  Slant board gastroc/soleus stretch 2x30 ea Eccentric heel lowering 4 in  step 2x10 Tandem stance on foam  2x30 each  Standing heel raise with ball 3 x15 Wobble board 2x30 fwd/bwd Ankle DF/inv/ev 2x10 TBg Manual Therapy: STM and TPR to R calf  Self Care: Discussed shoes and places to get quality shoes   [facility] Adult PT Treatment:                                                DATE: 07/10/2024 Therapeutic Exercise: Rec bike lvl 3 x 3 min for functional  Slant board gastroc/soleus stretch 2x30 ea Eccentric heel lowering 2in step 2x10 Tandem stance on foam 2x30 each Standing heel raise with ball 2x15 Wobble board 2x30 fwd/bwd Ankle DF/inv/ev 2x10 RTB Manual Therapy: STM and TPR to R calf  [facility] Adult PT Treatment:                                                DATE: 06/24/2024 Therapeutic Exercise: Standing gastroc stretch x 30 ea Standing soleus stretch x 30 ea Seated calf raise with ball x 10 Ankle inv/ev x 5 RTB  PATIENT EDUCATION:  Education details: eval findings, LEFS, HEP, POC Person educated: Patient Education method: Explanation, Demonstration, and Handouts Education comprehension: verbalized understanding and returned demonstration  HOME EXERCISE PROGRAM: Access Code: [email or web address] URL: [email or web address] Date: 06/24/2024 Prepared by: Alm Kingdom  Exercises - Standing Gastroc Stretch  - 2 x daily - 7 x weekly - 2-3 reps - 30 sec hold - Soleus Stretch on Wall  - 2 x daily - 7 x weekly - 2-3 reps - 30 sec hold - Seated Calf Raise With Small Ball at Heels  - 1 x daily - 7 x weekly - 3 sets - 10 reps - Ankle Inversion with Resistance  - 1 x daily - 7 x weekly - 3 sets - 10 reps - red band hold - Ankle Eversion with Resistance  - 1 x daily - 7 x weekly - 3 sets - 10 reps - red band hold - Sidelying Hip Abduction  - 1 x daily - 7 x weekly - 3 sets - 10 reps  ASSESSMENT:  CLINICAL IMPRESSION: Pt was able to complete all prescribed exercises with no adverse effect. Today we progressed  resistance for ankle strength  and increasing calf length with active  exercise. Pt responded well to manual therapy interventions noting decrease in tension and pain level reduced to 0-1/10 as per pt report. Discussed shoe wear and possible selections for plantar fasciitis reduction of pain and increased comfort All STG met as of this visit.  Scheduled additional appt for re- cert due 8-87-73  EVAL: Patient is a 43 y.o. F who was seen today for physical therapy evaluation and treatment for bilateral foot/ankle pain R>L. Physical findings are consistent with DPM impression as pt demonstrates decrease in bilateral DF ROM, foot intrinsic strength, and gait/mobility deficits. LEFS score shows moderate disability in performance of home ADLs and community activities. Pt would benefit from skilled PT services working on improving calf length and foot intrinsic strength in order to decrease pain and improve comfort.    OBJECTIVE IMPAIRMENTS: Abnormal gait, decreased activity tolerance, decreased mobility, decreased ROM, decreased strength, impaired flexibility, and pain  ACTIVITY LIMITATIONS: sitting, standing, squatting, stairs, transfers, and locomotion level  PARTICIPATION LIMITATIONS: meal prep,  cleaning, driving, shopping, community activity, occupation, and yard work  PERSONAL FACTORS: Time since onset of injury/illness/exacerbation are also affecting patient's functional outcome.   REHAB POTENTIAL: Excellent  CLINICAL DECISION MAKING: Stable/uncomplicated  EVALUATION COMPLEXITY: Low   GOALS: Goals reviewed with patient? No  SHORT TERM GOALS: Target date: 07/15/2024   Pt will be compliant and knowledgeable with initial HEP for improved comfort and carryover Baseline: initial HEP given  Goal status: MET  2.  Pt will self report bilateral foot/ankle pain no greater than 7/10 for improved comfort and functional ability Baseline: 9/10 at worst Goal status: MET  LONG TERM GOALS: Target date: 08/19/2024   Pt will improve LEFS to no less than 68/80 as proxy for  functional improvement with home ADLs and higher level community activity Baseline: 57/80 Goal status: INITIAL   2.  Pt will self report bilateral foot/ankle pain no greater than 3/10 for improved comfort and functional ability Baseline: 9/10 at worst Goal status: INITIAL   3.  Pt will improve bilateral ankle DF to no less than 14 degrees for improved comfort and functional mobility Baseline: see chart Goal status: INITIAL  4.  Pt will improve R ankle inv MMT to at least 5/5 for improved comfort and functional ability while decreasing pain Baseline:  Goal status: INITIAL   PLAN:  PT FREQUENCY: 1-2x/week  PT DURATION: 8 weeks  PLANNED INTERVENTIONS: 97164- PT Re-evaluation, 97110-Therapeutic exercises, 97530- Therapeutic activity, [id number]- Neuromuscular re-education, 97535- Self Care, [id number]- Manual therapy, [id number]- Gait training, [phone]- Electrical stimulation (unattended), [id number]- Electrical stimulation (manual), 97016- Vasopneumatic device, 20560 (1-2 muscles), 20561 (3+ muscles)- Dry Needling, Patient/Family education, Cryotherapy, and Moist heat  PLAN FOR NEXT SESSION: assess HEP response, foot intrinsic strengthening, calf stretching   Graydon Dingwall, PT, [facility] Certified Exercise Expert for the Aging Adult  07/24/2024 12:29 PM Phone: [phone] Fax: [phone]

## 2024-07-29 ENCOUNTER — Ambulatory Visit: Admitting: Podiatry

## 2024-07-29 DIAGNOSIS — Z91199 Patient's noncompliance with other medical treatment and regimen due to unspecified reason: Secondary | ICD-10-CM

## 2024-07-29 NOTE — Progress Notes (Signed)
 No show

## 2024-08-12 ENCOUNTER — Ambulatory Visit: Attending: Podiatry

## 2024-08-12 DIAGNOSIS — M79671 Pain in right foot: Secondary | ICD-10-CM | POA: Insufficient documentation

## 2024-08-12 DIAGNOSIS — M79672 Pain in left foot: Secondary | ICD-10-CM | POA: Diagnosis present

## 2024-08-12 DIAGNOSIS — M6281 Muscle weakness (generalized): Secondary | ICD-10-CM | POA: Insufficient documentation

## 2024-08-12 NOTE — Therapy (Signed)
 " OUTPATIENT PHYSICAL THERAPY TREATMENT   Patient Name: [name] MRN: [id number] DOB:[birth year], 44 y.o., female Today's Date: 08/12/2024  END OF SESSION:  PT End of Session - 08/12/24 0843     Visit Number 4    Number of Visits 9    Date for Recertification  08/19/24    Authorization Type [employer] [place]    PT Start Time 0845    PT Stop Time 0927    PT Time Calculation (min) 42 min    Activity Tolerance Patient tolerated treatment well    Behavior During Therapy [facility] for tasks assessed/performed            Past Medical History:  Diagnosis Date   Carpal tunnel syndrome on left 08/2016   Past Surgical History:  Procedure Laterality Date   CARPAL TUNNEL RELEASE Right 04/05/2016   Procedure: RIGHT LIMITED OPEN CARPAL TUNNEL RELEASE;  Surgeon: Elsie Mussel, MD;  Location: [facility];  Service: Orthopedics;  Laterality: Right;   CARPAL TUNNEL RELEASE Left 08/19/2016   Procedure: LEFT LIMITED OPEN CARPAL TUNNEL RELEASE;  Surgeon: Elsie Mussel, MD;  Location: [facility];  Service: Orthopedics;  Laterality: Left;   DILATION AND EVACUATION  03/19/2012   Procedure: DILATATION AND EVACUATION;  Surgeon: Burnard LABOR. Kandyce, MD;  Location: [facility];  Service: Gynecology;  Laterality: N/A;  With Ultrasound   EXCISIONAL BIOSPY LEFT DEEP NECK MASS  10/15/2007   Benign salivary gland tissue w/ chronic inflammation   WISDOM TOOTH EXTRACTION     Patient Active Problem List   Diagnosis Date Noted   Right lateral epicondylitis 06/25/2024   Chronic diarrhea 06/14/2021   Incomplete right bundle branch block (RBBB) determined by electrocardiography 06/26/2018   Low back pain 12/01/2015   Mild scoliosis 12/01/2015   Lipoma of back 12/01/2015   Gestational hypertension 03/18/2015   Migraine without aura 12/18/2007   SYSTOLIC MURMUR 12/18/2007    PCP: Avelina Greig BRAVO, MD   REFERRING PROVIDER: Joya Stabs, DPM   REFERRING DIAG:  M72.2 (ICD-10-CM) -  Plantar fasciitis, right M72.2 (ICD-10-CM) - Plantar fasciitis, left  THERAPY DIAG:  Pain in right foot  Pain in left foot  Muscle weakness (generalized)  Rationale for Evaluation and Treatment: Rehabilitation  ONSET DATE: July 2025  SUBJECTIVE:   SUBJECTIVE STATEMENT: Pt presents to PT with reports of continued foot/heel pain. Has continued HEP compliance and notes things are getting slowly better.   EVAL: Pt presents to PT with roughly 4 month hx of increasing bilateral foot/ankle pain R>L. Pain started after walking on beach for a long time while on vacation. It is worst with first step in morning and slowly alleviates, but it is very painful at night as she is on her feet throughout the day. Hasn't been able to walk or exercise due to foot pain.   PERTINENT HISTORY: See PMH  PAIN:  Are you having pain?  Yes: NPRS scale: 4/10 Worst: 9/10 Pain location: bilateral heels, medial heel Pain description: sharp, sore Aggravating factors: first step in morning, walking Relieving factors: rest  PRECAUTIONS: None  RED FLAGS: None   WEIGHT BEARING RESTRICTIONS: No  FALLS:  Has patient fallen in last 6 months? No  LIVING ENVIRONMENT: Lives with: lives with their family Lives in: House/apartment  OCCUPATION: Pharmacist at [employer]  PLOF: Independent  PATIENT GOALS: decrease foot pain, be able to walk recreationally with decreased pain  NEXT MD VISIT: 07/29/2024  OBJECTIVE:  Note: Objective measures were completed at  Evaluation unless otherwise noted.  DIAGNOSTIC FINDINGS: See imaging  PATIENT SURVEYS:  LEFS  Extreme difficulty/unable (0), Quite a bit of difficulty (1), Moderate difficulty (2), Little difficulty (3), No difficulty (4) Survey date:  06/24/2024  Any of your usual work, housework or school activities 3  2. Usual hobbies, recreational or sporting activities 3  3. Getting into/out of the bath 4  4. Walking between rooms 3  5. Putting on socks/shoes 4   6. Squatting  4  7. Lifting an object, like a bag of groceries from the floor 4  8. Performing light activities around your home 4  9. Performing heavy activities around your home 4  10. Getting into/out of a car 4  11. Walking 2 blocks 2  12. Walking 1 mile 2  13. Going up/down 10 stairs (1 flight) 4  14. Standing for 1 hour 3  15.  sitting for 1 hour 4  16. Running on even ground 2  17. Running on uneven ground 2  18. Making sharp turns while running fast 3  19. Hopping  3  20. Rolling over in bed 4  Score total:  57/80     COGNITION: Overall cognitive status: Within functional limits for tasks assessed     SENSATION: WFL  POSTURE: No Significant postural limitations  PALPATION: TTP to bilateral distal heel R>L  LOWER EXTREMITY ROM:  Active ROM Right eval Left eval  Ankle dorsiflexion 10 12  Ankle plantarflexion WNL WNL  Ankle inversion WNL WNL  Ankle eversion WNL WNL   (Blank rows = not tested)  LOWER EXTREMITY MMT:  MMT Right eval Left eval  Hip flexion    Hip extension    Hip abduction    Hip adduction    Hip internal rotation    Hip external rotation    Knee flexion    Knee extension    Ankle dorsiflexion 5 5  Ankle plantarflexion 5 5  Ankle inversion 4 5  Ankle eversion 5 5   (Blank rows = not tested)  LOWER EXTREMITY SPECIAL TESTS:  Ankle special tests: Great toe extension test: positive   FUNCTIONAL TESTS:  SLS: assess next session  GAIT: Distance walked: 68ft Assistive device utilized: None Level of assistance: Complete Independence Comments: no overt deviations                                                                                                                          TREATMENT: [facility] Adult PT Treatment:                                                DATE: 08/13/23 Rec bike lvl 3 x 6 min for functional  Slant board gastroc/soleus stretch x 30 ea Isometric heel raise x 5 - 20 hold Eccentric heel lowering 4 in step  2x10 Tandem  stance on foam 2x30 each SLS 2x30 on foam Side stepping toe/heel walk on foam beam 2x3 laps  Ankle eversion 2x15 GTB Manual Therapy: STM and TPR to R calf    [facility] Adult PT Treatment:                                                DATE: 07/24/24 Therapeutic Exercise: Rec bike lvl 3 x 6 min for functional  Slant board gastroc/soleus stretch 2x30 ea Eccentric heel lowering 4 in  step 2x10 Tandem stance on foam 2x30 each Standing heel raise with ball 3 x15 Wobble board 2x30 fwd/bwd Ankle DF/inv/ev 2x10 TBg Manual Therapy: STM and TPR to R calf Self Care: Discussed shoes and places to get quality shoes    [facility] Adult PT Treatment:                                                DATE: 07/10/2024 Therapeutic Exercise: Rec bike lvl 3 x 3 min for functional  Slant board gastroc/soleus stretch 2x30 ea Eccentric heel lowering 2in step 2x10 Tandem stance on foam 2x30 each Standing heel raise with ball 2x15 Wobble board 2x30 fwd/bwd Ankle DF/inv/ev 2x10 RTB Manual Therapy: STM and TPR to R calf  [facility] Adult PT Treatment:                                                DATE: 06/24/2024 Therapeutic Exercise: Standing gastroc stretch x 30 ea Standing soleus stretch x 30 ea Seated calf raise with ball x 10 Ankle inv/ev x 5 RTB  PATIENT EDUCATION:  Education details: eval findings, LEFS, HEP, POC Person educated: Patient Education method: Explanation, Demonstration, and Handouts Education comprehension: verbalized understanding and returned demonstration  HOME EXERCISE PROGRAM: Access Code: [email or web address] URL: [email or web address] Date: 06/24/2024 Prepared by: Alm Kingdom  Exercises - Standing Gastroc Stretch  - 2 x daily - 7 x weekly - 2-3 reps - 30 sec hold - Soleus Stretch on Wall  - 2 x daily - 7 x weekly - 2-3 reps - 30 sec hold - Seated Calf Raise With Small Ball at Heels  - 1 x daily - 7 x weekly - 3 sets - 10 reps - Ankle Inversion with  Resistance  - 1 x daily - 7 x weekly - 3 sets - 10 reps - red band hold - Ankle Eversion with Resistance  - 1 x daily - 7 x weekly - 3 sets - 10 reps - red band hold - Sidelying Hip Abduction  - 1 x daily - 7 x weekly - 3 sets - 10 reps  ASSESSMENT:  CLINICAL IMPRESSION: Pt was able to tolerate treatment well with no adverse effect. Today we focused on continued ankle stability, foot intrinsic strength, and distal LE strength in working towards decreasing foot/ankle pain. She responded well to manual therapy interventions noting decreased pain post session. Pt continues to benefit from skilled PT services, will assess goals and continued need for therapy next session.  EVAL: Patient is a 44 y.o. F who was seen today for physical therapy evaluation and  treatment for bilateral foot/ankle pain R>L. Physical findings are consistent with DPM impression as pt demonstrates decrease in bilateral DF ROM, foot intrinsic strength, and gait/mobility deficits. LEFS score shows moderate disability in performance of home ADLs and community activities. Pt would benefit from skilled PT services working on improving calf length and foot intrinsic strength in order to decrease pain and improve comfort.    OBJECTIVE IMPAIRMENTS: Abnormal gait, decreased activity tolerance, decreased mobility, decreased ROM, decreased strength, impaired flexibility, and pain  ACTIVITY LIMITATIONS: sitting, standing, squatting, stairs, transfers, and locomotion level  PARTICIPATION LIMITATIONS: meal prep, cleaning, driving, shopping, community activity, occupation, and yard work  PERSONAL FACTORS: Time since onset of injury/illness/exacerbation are also affecting patient's functional outcome.   REHAB POTENTIAL: Excellent  CLINICAL DECISION MAKING: Stable/uncomplicated  EVALUATION COMPLEXITY: Low   GOALS: Goals reviewed with patient? No  SHORT TERM GOALS: Target date: 07/15/2024   Pt will be compliant and knowledgeable with  initial HEP for improved comfort and carryover Baseline: initial HEP given  Goal status: MET  2.  Pt will self report bilateral foot/ankle pain no greater than 7/10 for improved comfort and functional ability Baseline: 9/10 at worst Goal status: MET  LONG TERM GOALS: Target date: 08/19/2024   Pt will improve LEFS to no less than 68/80 as proxy for functional improvement with home ADLs and higher level community activity Baseline: 57/80 Goal status: INITIAL   2.  Pt will self report bilateral foot/ankle pain no greater than 3/10 for improved comfort and functional ability Baseline: 9/10 at worst Goal status: INITIAL   3.  Pt will improve bilateral ankle DF to no less than 14 degrees for improved comfort and functional mobility Baseline: see chart Goal status: INITIAL  4.  Pt will improve R ankle inv MMT to at least 5/5 for improved comfort and functional ability while decreasing pain Baseline:  Goal status: INITIAL   PLAN:  PT FREQUENCY: 1-2x/week  PT DURATION: 8 weeks  PLANNED INTERVENTIONS: 97164- PT Re-evaluation, 97110-Therapeutic exercises, 97530- Therapeutic activity, [id number]- Neuromuscular re-education, 97535- Self Care, 02859- Manual therapy, [id number]- Gait training, [phone]- Electrical stimulation (unattended), [id number]- Electrical stimulation (manual), 97016- Vasopneumatic device, 20560 (1-2 muscles), 20561 (3+ muscles)- Dry Needling, Patient/Family education, Cryotherapy, and Moist heat  PLAN FOR NEXT SESSION: assess HEP response, foot intrinsic strengthening, calf stretching   Alm JAYSON Kingdom PT  08/12/2024 9:36 AM  "

## 2024-08-19 ENCOUNTER — Ambulatory Visit

## 2024-08-19 DIAGNOSIS — M79671 Pain in right foot: Secondary | ICD-10-CM | POA: Diagnosis not present

## 2024-08-19 DIAGNOSIS — M6281 Muscle weakness (generalized): Secondary | ICD-10-CM

## 2024-08-19 DIAGNOSIS — M79672 Pain in left foot: Secondary | ICD-10-CM

## 2024-08-19 NOTE — Therapy (Signed)
 " OUTPATIENT PHYSICAL THERAPY TREATMENT PHYSICAL THERAPY DISCHARGE SUMMARY  Visits from Start of Care: 5  Current functional level related to goals / functional outcomes: N/a   Remaining deficits: N/a   Education / Equipment: HEP   Patient agrees to discharge. Patient goals were met. Patient is being discharged due to being pleased with the current functional level.   Patient Name: [name] MRN: [id number] DOB:Sep 19, 1980, 44 y.o., female Today's Date: 08/19/2024  END OF SESSION:  PT End of Session - 08/19/24 1620     Visit Number 5    Number of Visits 9    Date for Recertification  08/19/24    Authorization Type [employer]    PT Start Time 1615    PT Stop Time 1640    PT Time Calculation (min) 25 min    Activity Tolerance Patient tolerated treatment well    Behavior During Therapy [facility] for tasks assessed/performed             Past Medical History:  Diagnosis Date   Carpal tunnel syndrome on left 08/2016   Past Surgical History:  Procedure Laterality Date   CARPAL TUNNEL RELEASE Right 04/05/2016   Procedure: RIGHT LIMITED OPEN CARPAL TUNNEL RELEASE;  Surgeon: Elsie Mussel, MD;  Location: [facility];  Service: Orthopedics;  Laterality: Right;   CARPAL TUNNEL RELEASE Left 08/19/2016   Procedure: LEFT LIMITED OPEN CARPAL TUNNEL RELEASE;  Surgeon: Elsie Mussel, MD;  Location: [facility];  Service: Orthopedics;  Laterality: Left;   DILATION AND EVACUATION  03/19/2012   Procedure: DILATATION AND EVACUATION;  Surgeon: Burnard LABOR. Kandyce, MD;  Location: [facility];  Service: Gynecology;  Laterality: N/A;  With Ultrasound   EXCISIONAL BIOSPY LEFT DEEP NECK MASS  10/15/2007   Benign salivary gland tissue w/ chronic inflammation   WISDOM TOOTH EXTRACTION     Patient Active Problem List   Diagnosis Date Noted   Right lateral epicondylitis 06/25/2024   Chronic diarrhea 06/14/2021   Incomplete right bundle branch block (RBBB)  determined by electrocardiography 06/26/2018   Low back pain 12/01/2015   Mild scoliosis 12/01/2015   Lipoma of back 12/01/2015   Gestational hypertension 03/18/2015   Migraine without aura 12/18/2007   SYSTOLIC MURMUR 12/18/2007    PCP: Avelina Greig BRAVO, MD   REFERRING PROVIDER: Joya Stabs, DPM   REFERRING DIAG:  M72.2 (ICD-10-CM) - Plantar fasciitis, right M72.2 (ICD-10-CM) - Plantar fasciitis, left  THERAPY DIAG:  Pain in right foot  Pain in left foot  Muscle weakness (generalized)  Rationale for Evaluation and Treatment: Rehabilitation  ONSET DATE: July 2025  SUBJECTIVE:   SUBJECTIVE STATEMENT: Pt presents to PT with reports of continued foot/heel pain. Has continued HEP compliance and notes things are getting slowly better.   EVAL: Pt presents to PT with roughly 4 month hx of increasing bilateral foot/ankle pain R>L. Pain started after walking on beach for a long time while on vacation. It is worst with first step in morning and slowly alleviates, but it is very painful at night as she is on her feet throughout the day. Hasn't been able to walk or exercise due to foot pain.   PERTINENT HISTORY: See PMH  PAIN:  Are you having pain?  Yes: NPRS scale: 4/10 Worst: 9/10 Pain location: bilateral heels, medial heel Pain description: sharp, sore Aggravating factors: first step in morning, walking Relieving factors: rest  PRECAUTIONS: None  RED FLAGS: None   WEIGHT BEARING RESTRICTIONS: No  FALLS:  Has patient fallen in last 6 months? No  LIVING ENVIRONMENT: Lives with: lives with their family Lives in: House/apartment  OCCUPATION: Pharmacist at [employer]  PLOF: Independent  PATIENT GOALS: decrease foot pain, be able to walk recreationally with decreased pain  NEXT MD VISIT: 07/29/2024  OBJECTIVE:  Note: Objective measures were completed at Evaluation unless otherwise noted.  DIAGNOSTIC FINDINGS: See imaging  PATIENT SURVEYS:  LEFS  Extreme  difficulty/unable (0), Quite a bit of difficulty (1), Moderate difficulty (2), Little difficulty (3), No difficulty (4) Survey date:  06/24/2024  Any of your usual work, housework or school activities 3  2. Usual hobbies, recreational or sporting activities 3  3. Getting into/out of the bath 4  4. Walking between rooms 3  5. Putting on socks/shoes 4  6. Squatting  4  7. Lifting an object, like a bag of groceries from the floor 4  8. Performing light activities around your home 4  9. Performing heavy activities around your home 4  10. Getting into/out of a car 4  11. Walking 2 blocks 2  12. Walking 1 mile 2  13. Going up/down 10 stairs (1 flight) 4  14. Standing for 1 hour 3  15.  sitting for 1 hour 4  16. Running on even ground 2  17. Running on uneven ground 2  18. Making sharp turns while running fast 3  19. Hopping  3  20. Rolling over in bed 4  Score total:  57/80, 74     COGNITION: Overall cognitive status: Within functional limits for tasks assessed     SENSATION: WFL  POSTURE: No Significant postural limitations  PALPATION: TTP to bilateral distal heel R>L  LOWER EXTREMITY ROM: Reassessed 08/19/24  Active ROM Right eval Left eval  Ankle dorsiflexion 10, wfl 12, wfl  Ankle plantarflexion WNL WNL  Ankle inversion WNL WNL  Ankle eversion WNL WNL   (Blank rows = not tested)  LOWER EXTREMITY MMT: Reassessed 08/19/24  MMT Right eval Left eval  Hip flexion    Hip extension    Hip abduction    Hip adduction    Hip internal rotation    Hip external rotation    Knee flexion    Knee extension    Ankle dorsiflexion 5 5  Ankle plantarflexion 5 5  Ankle inversion 4,5  5  Ankle eversion 5 5   (Blank rows = not tested)  LOWER EXTREMITY SPECIAL TESTS:  Ankle special tests: Great toe extension test: positive   FUNCTIONAL TESTS:  SLS: assess next session  GAIT: Distance walked: 4ft Assistive device utilized: None Level of assistance: Complete  Independence Comments: no overt deviations                                                                                                                          TREATMENT: [facility] Adult PT Treatment:  DATE: 08/13/23 Rec bike lvl 3 x 6 min for functional  Slant board gastroc/soleus stretch x 30 ea Isometric heel raise x 5 - 20 hold Eccentric heel lowering 4 in step 2x10 Tandem stance on foam 2x30 each SLS 2x30 on foam Side stepping toe/heel walk on foam beam 2x3 laps  Ankle eversion 2x15 GTB Manual Therapy: STM and TPR to R calf    [facility] Adult PT Treatment:                                                DATE: 07/24/24 Therapeutic Exercise: Rec bike lvl 3 x 6 min for functional  Slant board gastroc/soleus stretch 2x30 ea Eccentric heel lowering 4 in  step 2x10 Tandem stance on foam 2x30 each Standing heel raise with ball 3 x15 Wobble board 2x30 fwd/bwd Ankle DF/inv/ev 2x10 TBg Manual Therapy: STM and TPR to R calf Self Care: Discussed shoes and places to get quality shoes    [facility] Adult PT Treatment:                                                DATE: 07/10/2024 Therapeutic Exercise: Rec bike lvl 3 x 3 min for functional  Slant board gastroc/soleus stretch 2x30 ea Eccentric heel lowering 2in step 2x10 Tandem stance on foam 2x30 each Standing heel raise with ball 2x15 Wobble board 2x30 fwd/bwd Ankle DF/inv/ev 2x10 RTB Manual Therapy: STM and TPR to R calf  [facility] Adult PT Treatment:                                                DATE: 06/24/2024 Therapeutic Exercise: Standing gastroc stretch x 30 ea Standing soleus stretch x 30 ea Seated calf raise with ball x 10 Ankle inv/ev x 5 RTB  PATIENT EDUCATION:  Education details: eval findings, LEFS, HEP, POC Person educated: Patient Education method: Explanation, Demonstration, and Handouts Education comprehension: verbalized understanding and returned  demonstration  HOME EXERCISE PROGRAM: Access Code: [email or web address] URL: [email or web address] Date: 06/24/2024 Prepared by: Alm Kingdom  Exercises - Standing Gastroc Stretch  - 2 x daily - 7 x weekly - 2-3 reps - 30 sec hold - Soleus Stretch on Wall  - 2 x daily - 7 x weekly - 2-3 reps - 30 sec hold - Seated Calf Raise With Small Ball at Heels  - 1 x daily - 7 x weekly - 3 sets - 10 reps - Ankle Inversion with Resistance  - 1 x daily - 7 x weekly - 3 sets - 10 reps - red band hold - Ankle Eversion with Resistance  - 1 x daily - 7 x weekly - 3 sets - 10 reps - red band hold - Sidelying Hip Abduction  - 1 x daily - 7 x weekly - 3 sets - 10 reps  ASSESSMENT:  CLINICAL IMPRESSION: Pt satisfied with progress and independent with HEP alongside meeting most of PT goals. As a result, pt discharged from PT.  EVAL: Patient is a 44 y.o. F who was seen today for physical therapy evaluation and treatment for bilateral foot/ankle pain  R>L. Physical findings are consistent with DPM impression as pt demonstrates decrease in bilateral DF ROM, foot intrinsic strength, and gait/mobility deficits. LEFS score shows moderate disability in performance of home ADLs and community activities. Pt would benefit from skilled PT services working on improving calf length and foot intrinsic strength in order to decrease pain and improve comfort.    OBJECTIVE IMPAIRMENTS: Abnormal gait, decreased activity tolerance, decreased mobility, decreased ROM, decreased strength, impaired flexibility, and pain  ACTIVITY LIMITATIONS: sitting, standing, squatting, stairs, transfers, and locomotion level  PARTICIPATION LIMITATIONS: meal prep, cleaning, driving, shopping, community activity, occupation, and yard work  PERSONAL FACTORS: Time since onset of injury/illness/exacerbation are also affecting patient's functional outcome.   REHAB POTENTIAL: Excellent  CLINICAL DECISION MAKING: Stable/uncomplicated  EVALUATION  COMPLEXITY: Low   GOALS: Goals reviewed with patient? No  SHORT TERM GOALS: Target date: 07/15/2024   Pt will be compliant and knowledgeable with initial HEP for improved comfort and carryover Baseline: initial HEP given  Goal status: MET  2.  Pt will self report bilateral foot/ankle pain no greater than 7/10 for improved comfort and functional ability Baseline: 9/10 at worst Goal status: MET  LONG TERM GOALS: Target date: 08/19/2024   Pt will improve LEFS to no less than 68/80 as proxy for functional improvement with home ADLs and higher level community activity Baseline: 57/80 Goal status: MET   2.  Pt will self report bilateral foot/ankle pain no greater than 3/10 for improved comfort and functional ability Baseline: 9/10 at worst Goal status: Not MET   3.  Pt will improve bilateral ankle DF to no less than 14 degrees for improved comfort and functional mobility Baseline: see chart Goal status: MET  4.  Pt will improve R ankle inv MMT to at least 5/5 for improved comfort and functional ability while decreasing pain Baseline:  Goal status: MET   PLAN:  PT FREQUENCY: 1-2x/week  PT DURATION: 8 weeks  PLANNED INTERVENTIONS: 97164- PT Re-evaluation, 97110-Therapeutic exercises, 97530- Therapeutic activity, [id number]- Neuromuscular re-education, 97535- Self Care, [id number]- Manual therapy, [id number]- Gait training, [id number]- Electrical stimulation (unattended), [id number]- Electrical stimulation (manual), 97016- Vasopneumatic device, 20560 (1-2 muscles), 20561 (3+ muscles)- Dry Needling, Patient/Family education, Cryotherapy, and Moist heat  PLAN FOR NEXT SESSION: PT DC  [place] Greener Beverely Suen PT  08/19/2024 7:52 PM  "
